# Patient Record
Sex: Female | Born: 1960 | Race: White | Hispanic: No | Marital: Married | Smoking: Never smoker
Health system: Southern US, Community
[De-identification: ages and names within clinical notes are randomized; demographics above are authoritative.]

## PROBLEM LIST (undated history)

## (undated) DIAGNOSIS — Z87442 Personal history of urinary calculi: Secondary | ICD-10-CM

## (undated) DIAGNOSIS — E785 Hyperlipidemia, unspecified: Secondary | ICD-10-CM

## (undated) DIAGNOSIS — K219 Gastro-esophageal reflux disease without esophagitis: Secondary | ICD-10-CM

## (undated) DIAGNOSIS — E041 Nontoxic single thyroid nodule: Secondary | ICD-10-CM

## (undated) DIAGNOSIS — E119 Type 2 diabetes mellitus without complications: Secondary | ICD-10-CM

## (undated) DIAGNOSIS — E669 Obesity, unspecified: Secondary | ICD-10-CM

## (undated) DIAGNOSIS — Z8709 Personal history of other diseases of the respiratory system: Secondary | ICD-10-CM

## (undated) HISTORY — PX: ABDOMINAL HYSTERECTOMY: SHX81

## (undated) HISTORY — DX: Personal history of other diseases of the respiratory system: Z87.09

## (undated) HISTORY — DX: Hyperlipidemia, unspecified: E78.5

## (undated) HISTORY — PX: CHOLECYSTECTOMY: SHX55

## (undated) HISTORY — PX: APPENDECTOMY: SHX54

## (undated) HISTORY — PX: TONSILLECTOMY: SUR1361

## (undated) HISTORY — DX: Obesity, unspecified: E66.9

## (undated) HISTORY — PX: ADENOIDECTOMY: SUR15

## (undated) HISTORY — DX: Gastro-esophageal reflux disease without esophagitis: K21.9

## (undated) HISTORY — PX: BIOPSY THYROID: PRO38

---

## 2006-10-24 HISTORY — PX: ESOPHAGOGASTRODUODENOSCOPY: SHX1529

## 2015-08-13 HISTORY — PX: COLONOSCOPY: SHX174

## 2015-08-23 ENCOUNTER — Encounter: Payer: Self-pay | Admitting: Gastroenterology

## 2019-07-15 DIAGNOSIS — N390 Urinary tract infection, site not specified: Secondary | ICD-10-CM | POA: Diagnosis not present

## 2019-09-03 DIAGNOSIS — I129 Hypertensive chronic kidney disease with stage 1 through stage 4 chronic kidney disease, or unspecified chronic kidney disease: Secondary | ICD-10-CM | POA: Diagnosis not present

## 2019-09-03 DIAGNOSIS — E1169 Type 2 diabetes mellitus with other specified complication: Secondary | ICD-10-CM | POA: Diagnosis not present

## 2019-09-03 DIAGNOSIS — N390 Urinary tract infection, site not specified: Secondary | ICD-10-CM | POA: Diagnosis not present

## 2019-09-03 DIAGNOSIS — E785 Hyperlipidemia, unspecified: Secondary | ICD-10-CM | POA: Diagnosis not present

## 2019-09-03 DIAGNOSIS — E119 Type 2 diabetes mellitus without complications: Secondary | ICD-10-CM | POA: Diagnosis not present

## 2019-09-05 DIAGNOSIS — D1721 Benign lipomatous neoplasm of skin and subcutaneous tissue of right arm: Secondary | ICD-10-CM | POA: Diagnosis not present

## 2019-09-12 DIAGNOSIS — Z1231 Encounter for screening mammogram for malignant neoplasm of breast: Secondary | ICD-10-CM | POA: Diagnosis not present

## 2019-09-16 DIAGNOSIS — G8929 Other chronic pain: Secondary | ICD-10-CM | POA: Insufficient documentation

## 2019-09-16 DIAGNOSIS — M1712 Unilateral primary osteoarthritis, left knee: Secondary | ICD-10-CM | POA: Diagnosis not present

## 2019-09-16 DIAGNOSIS — M25562 Pain in left knee: Secondary | ICD-10-CM | POA: Diagnosis not present

## 2019-09-17 DIAGNOSIS — N390 Urinary tract infection, site not specified: Secondary | ICD-10-CM | POA: Diagnosis not present

## 2019-09-17 DIAGNOSIS — M25562 Pain in left knee: Secondary | ICD-10-CM | POA: Diagnosis not present

## 2019-09-17 DIAGNOSIS — M1712 Unilateral primary osteoarthritis, left knee: Secondary | ICD-10-CM | POA: Diagnosis not present

## 2019-09-25 DIAGNOSIS — M25562 Pain in left knee: Secondary | ICD-10-CM | POA: Diagnosis not present

## 2019-09-25 DIAGNOSIS — M7052 Other bursitis of knee, left knee: Secondary | ICD-10-CM | POA: Diagnosis not present

## 2019-10-15 DIAGNOSIS — N760 Acute vaginitis: Secondary | ICD-10-CM | POA: Diagnosis not present

## 2019-10-15 DIAGNOSIS — R102 Pelvic and perineal pain: Secondary | ICD-10-CM | POA: Diagnosis not present

## 2020-03-03 DIAGNOSIS — E1169 Type 2 diabetes mellitus with other specified complication: Secondary | ICD-10-CM | POA: Diagnosis not present

## 2020-03-03 DIAGNOSIS — E785 Hyperlipidemia, unspecified: Secondary | ICD-10-CM | POA: Diagnosis not present

## 2020-03-03 DIAGNOSIS — E119 Type 2 diabetes mellitus without complications: Secondary | ICD-10-CM | POA: Diagnosis not present

## 2020-03-03 DIAGNOSIS — Z1331 Encounter for screening for depression: Secondary | ICD-10-CM | POA: Diagnosis not present

## 2020-03-03 DIAGNOSIS — Z6841 Body Mass Index (BMI) 40.0 and over, adult: Secondary | ICD-10-CM | POA: Diagnosis not present

## 2020-04-07 DIAGNOSIS — N907 Vulvar cyst: Secondary | ICD-10-CM | POA: Diagnosis not present

## 2020-04-19 DIAGNOSIS — N907 Vulvar cyst: Secondary | ICD-10-CM | POA: Diagnosis not present

## 2020-06-07 DIAGNOSIS — R42 Dizziness and giddiness: Secondary | ICD-10-CM | POA: Diagnosis not present

## 2020-08-08 DIAGNOSIS — B029 Zoster without complications: Secondary | ICD-10-CM | POA: Diagnosis not present

## 2020-09-08 DIAGNOSIS — R109 Unspecified abdominal pain: Secondary | ICD-10-CM | POA: Diagnosis not present

## 2020-09-08 DIAGNOSIS — U071 COVID-19: Secondary | ICD-10-CM | POA: Diagnosis not present

## 2020-09-08 DIAGNOSIS — M549 Dorsalgia, unspecified: Secondary | ICD-10-CM | POA: Diagnosis not present

## 2020-09-08 DIAGNOSIS — E119 Type 2 diabetes mellitus without complications: Secondary | ICD-10-CM | POA: Diagnosis not present

## 2020-09-09 DIAGNOSIS — R1011 Right upper quadrant pain: Secondary | ICD-10-CM | POA: Diagnosis not present

## 2020-09-15 DIAGNOSIS — R1011 Right upper quadrant pain: Secondary | ICD-10-CM | POA: Diagnosis not present

## 2020-10-27 DIAGNOSIS — Z0000 Encounter for general adult medical examination without abnormal findings: Secondary | ICD-10-CM | POA: Diagnosis not present

## 2020-10-27 DIAGNOSIS — Z6841 Body Mass Index (BMI) 40.0 and over, adult: Secondary | ICD-10-CM | POA: Diagnosis not present

## 2020-10-27 DIAGNOSIS — Z1231 Encounter for screening mammogram for malignant neoplasm of breast: Secondary | ICD-10-CM | POA: Diagnosis not present

## 2020-10-27 DIAGNOSIS — Z1211 Encounter for screening for malignant neoplasm of colon: Secondary | ICD-10-CM | POA: Diagnosis not present

## 2020-11-17 DIAGNOSIS — Z1231 Encounter for screening mammogram for malignant neoplasm of breast: Secondary | ICD-10-CM | POA: Diagnosis not present

## 2021-02-02 DIAGNOSIS — K219 Gastro-esophageal reflux disease without esophagitis: Secondary | ICD-10-CM | POA: Diagnosis not present

## 2021-02-02 DIAGNOSIS — E049 Nontoxic goiter, unspecified: Secondary | ICD-10-CM | POA: Diagnosis not present

## 2021-02-02 DIAGNOSIS — E785 Hyperlipidemia, unspecified: Secondary | ICD-10-CM | POA: Diagnosis not present

## 2021-02-02 DIAGNOSIS — E1169 Type 2 diabetes mellitus with other specified complication: Secondary | ICD-10-CM | POA: Diagnosis not present

## 2021-02-02 DIAGNOSIS — E119 Type 2 diabetes mellitus without complications: Secondary | ICD-10-CM | POA: Diagnosis not present

## 2021-02-08 DIAGNOSIS — E041 Nontoxic single thyroid nodule: Secondary | ICD-10-CM | POA: Diagnosis not present

## 2021-02-08 DIAGNOSIS — R1310 Dysphagia, unspecified: Secondary | ICD-10-CM | POA: Diagnosis not present

## 2021-02-08 DIAGNOSIS — E049 Nontoxic goiter, unspecified: Secondary | ICD-10-CM | POA: Diagnosis not present

## 2021-02-22 DIAGNOSIS — E041 Nontoxic single thyroid nodule: Secondary | ICD-10-CM | POA: Diagnosis not present

## 2021-04-13 DIAGNOSIS — E042 Nontoxic multinodular goiter: Secondary | ICD-10-CM | POA: Diagnosis not present

## 2021-04-19 ENCOUNTER — Other Ambulatory Visit: Payer: Self-pay | Admitting: Surgery

## 2021-04-19 DIAGNOSIS — E042 Nontoxic multinodular goiter: Secondary | ICD-10-CM

## 2021-04-20 ENCOUNTER — Other Ambulatory Visit: Payer: Self-pay | Admitting: Surgery

## 2021-04-20 DIAGNOSIS — E042 Nontoxic multinodular goiter: Secondary | ICD-10-CM

## 2021-04-29 ENCOUNTER — Other Ambulatory Visit: Payer: Self-pay

## 2021-05-04 DIAGNOSIS — M545 Low back pain, unspecified: Secondary | ICD-10-CM | POA: Diagnosis not present

## 2021-05-04 DIAGNOSIS — E079 Disorder of thyroid, unspecified: Secondary | ICD-10-CM | POA: Diagnosis not present

## 2021-05-05 ENCOUNTER — Ambulatory Visit
Admission: RE | Admit: 2021-05-05 | Discharge: 2021-05-05 | Disposition: A | Discharge status: Home / Self Care | Payer: BC Managed Care – PPO | Source: Ambulatory Visit | Attending: Surgery | Admitting: Surgery

## 2021-05-05 ENCOUNTER — Other Ambulatory Visit (HOSPITAL_COMMUNITY)
Admission: RE | Admit: 2021-05-05 | Discharge: 2021-05-05 | Disposition: A | Discharge status: Home / Self Care | Payer: BC Managed Care – PPO | Source: Ambulatory Visit | Attending: Surgery | Admitting: Surgery

## 2021-05-05 DIAGNOSIS — E042 Nontoxic multinodular goiter: Secondary | ICD-10-CM

## 2021-05-05 DIAGNOSIS — E041 Nontoxic single thyroid nodule: Secondary | ICD-10-CM | POA: Diagnosis not present

## 2021-05-05 DIAGNOSIS — D440 Neoplasm of uncertain behavior of thyroid gland: Secondary | ICD-10-CM | POA: Diagnosis not present

## 2021-05-05 DIAGNOSIS — E079 Disorder of thyroid, unspecified: Secondary | ICD-10-CM | POA: Diagnosis not present

## 2021-05-06 LAB — CYTOLOGY - NON PAP

## 2021-05-09 NOTE — Progress Notes (Signed)
FNA biopsy is insufficient for diagnosis.  This was a second attempt.  Check with the patient.  If she is willing to try a third time, order FNA biopsy and let them know it is third attempt.  If she doesn't want to do another biopsy, then schedule office appointment in near future to discuss surgery.  Thanks,  tmg  Armandina Gemma, MD [facility] Surgery A [place] practice Office: [phone]

## 2021-05-11 DIAGNOSIS — E1169 Type 2 diabetes mellitus with other specified complication: Secondary | ICD-10-CM | POA: Diagnosis not present

## 2021-05-11 DIAGNOSIS — R946 Abnormal results of thyroid function studies: Secondary | ICD-10-CM | POA: Diagnosis not present

## 2021-05-11 DIAGNOSIS — Z1331 Encounter for screening for depression: Secondary | ICD-10-CM | POA: Diagnosis not present

## 2021-05-11 DIAGNOSIS — E785 Hyperlipidemia, unspecified: Secondary | ICD-10-CM | POA: Diagnosis not present

## 2021-05-25 ENCOUNTER — Ambulatory Visit: Payer: Self-pay | Admitting: Surgery

## 2021-05-25 DIAGNOSIS — E042 Nontoxic multinodular goiter: Secondary | ICD-10-CM | POA: Diagnosis not present

## 2021-05-30 NOTE — Patient Instructions (Addendum)
DUE TO COVID-19 ONLY ONE VISITOR IS ALLOWED TO COME WITH YOU AND STAY IN THE WAITING ROOM ONLY DURING PRE OP AND PROCEDURE DAY OF SURGERY.   Up to two visitors ages 16+ are allowed at one time in a patient's room.  The visitors may rotate out with other people throughout the day.  Additionally, up to two children between the ages of 20 and 64 are allowed and do not count toward the number of allowed visitors.  Children within this age range must be accompanied by an adult visitor.  One adult visitor may remain with the patient overnight and must be in the room by 8 PM.  YOU NEED TO HAVE A COVID 19 TEST ON_2-28-23  @   ______ THIS TEST MUST BE DONE BEFORE SURGERY,     COVID TESTING [place] COME IN THROUGH MAIN ENTRANCE BE SEATED INT THE LOBBY AREA TO THE RIGHT AS YOU COME IN THE MAIN ENTRANCE DIAL [phone] GIVE THEM YOUR NAME AND LET THEM KNOW YOU ARE HERE FOR COVID TESTING    ONCE YOUR COVID TEST IS COMPLETED,  PLEASE Wear a mask when in [place]           Your procedure is scheduled on: 06-09-21   Report to [facility] Main  Entrance   Report to admitting at        [place] AM     Call this number if you have problems the morning of surgery [phone]   Remember: Do not eat food :After Midnight. You may have clear liquids until 0700 am the morning of your surgery then NOTHING BY MOUTH    CLEAR LIQUID DIET                                                                    water Black Coffee and tea, regular and decaf No Creamer or milk                          Plain Jell-O any favor except   NO red                                   Fruit ices (not with fruit pulp)                                      Iced Popsicles                                                                      Cranberry, grape and apple juices Sports drinks like Gatorade Lightly seasoned clear broth or consume(fat free) Sugar, honey  syrup  _____________________________________________________________________      BRUSH YOUR TEETH MORNING OF SURGERY AND RINSE YOUR MOUTH OUT, NO CHEWING GUM CANDY OR  MINTS.     Take these medicines the morning of surgery with A SIP OF WATER: tylenol if needed  [place]   DO NOT TAKE ANY DIABETIC MEDICATIONS DAY OF YOUR SURGERY                               You may not have any metal on your body including hair pins and              piercings  Do not wear jewelry, make-up, lotions, powders,perfumes,        deodorant             Do not wear nail polish on your fingernails or toenails .  Do not shave  48 hours prior to surgery.               Do not bring valuables to the hospital. [place].  Contacts, dentures or bridgework may not be worn into surgery.  You may bring a small overnight bag with you     Patients discharged the day of surgery will not be allowed to drive home. IF YOU ARE HAVING SURGERY AND GOING HOME THE SAME DAY, YOU MUST HAVE AN ADULT TO DRIVE YOU HOME AND BE WITH YOU FOR 24 HOURS. YOU MAY GO HOME BY TAXI OR UBER OR ORTHERWISE, BUT AN ADULT MUST ACCOMPANY YOU HOME AND STAY WITH YOU FOR 24 HOURS.  Name and phone number of your driver:  Special Instructions: N/A              Please read over the following fact sheets you were given: _____________________________________________________________________             [facility] - Preparing for Surgery Before surgery, you can play an important role.  Because skin is not sterile, your skin needs to be as free of germs as possible.  You can reduce the number of germs on your skin by washing with CHG (chlorahexidine gluconate) soap before surgery.  CHG is an antiseptic cleaner which kills germs and bonds with the skin to continue killing germs even after washing. Please DO NOT use if you have an allergy to CHG or antibacterial soaps.  If your skin  becomes reddened/irritated stop using the CHG and inform your nurse when you arrive at Short Stay. Do not shave (including legs and underarms) for at least 48 hours prior to the first CHG shower.  You may shave your face/neck. Please follow these instructions carefully:  1.  Shower with CHG Soap the night before surgery and the  morning of Surgery.  2.  If you choose to wash your hair, wash your hair first as usual with your  normal  shampoo.  3.  After you shampoo, rinse your hair and body thoroughly to remove the  shampoo.                           4.  Use CHG as you would any other liquid soap.  You can apply chg directly  to the skin and wash                       Gently with a scrungie or clean washcloth.  5.  Apply the CHG  Soap to your body ONLY FROM THE NECK DOWN.   Do not use on face/ open                           Wound or open sores. Avoid contact with eyes, ears mouth and genitals (private parts).                       Wash face,  Genitals (private parts) with your normal soap.             6.  Wash thoroughly, paying special attention to the area where your surgery  will be performed.  7.  Thoroughly rinse your body with warm water from the neck down.  8.  DO NOT shower/wash with your normal soap after using and rinsing off  the CHG Soap.                9.  Pat yourself dry with a clean towel.            10.  Wear clean pajamas.            11.  Place clean sheets on your bed the night of your first shower and do not  sleep with pets. Day of Surgery : Do not apply any lotions/deodorants the morning of surgery.  Please wear clean clothes to the hospital/surgery center.  FAILURE TO FOLLOW THESE INSTRUCTIONS MAY RESULT IN THE CANCELLATION OF YOUR SURGERY PATIENT SIGNATURE_________________________________  NURSE SIGNATURE__________________________________  ________________________________________________________________________

## 2021-05-30 NOTE — Progress Notes (Addendum)
PCP - Dr. Lucilla Lame office   [place] Cardiologist - no  PPM/ICD -  Device Orders -  Rep Notified -   Chest x-ray -  EKG - 06-01-21 Stress Test -  ECHO -  Cardiac Cath -   Sleep Study -  CPAP -   Fasting Blood Sugar -  Checks Blood Sugar _____ times a day  Blood Thinner Instructions: Aspirin Instructions:  ERAS Protcol - PRE-SURGERY Ensure or G2-   COVID TEST- 06-07-21 COVID vaccine -no  Activity--Able to walk a flight of stairs without SOB Anesthesia review: DM  Patient denies shortness of breath, fever, cough and chest pain at PAT appointment   All instructions explained to the patient, with a verbal understanding of the material. Patient agrees to go over the instructions while at home for a better understanding. Patient also instructed to self quarantine after being tested for COVID-19. The opportunity to ask questions was provided.

## 2021-06-01 ENCOUNTER — Ambulatory Visit (HOSPITAL_COMMUNITY)
Admission: RE | Admit: 2021-06-01 | Discharge: 2021-06-01 | Disposition: A | Discharge status: Home / Self Care | Payer: BC Managed Care – PPO | Source: Ambulatory Visit | Attending: Anesthesiology | Admitting: Anesthesiology

## 2021-06-01 ENCOUNTER — Encounter (HOSPITAL_COMMUNITY)
Admission: RE | Admit: 2021-06-01 | Discharge: 2021-06-01 | Disposition: A | Discharge status: Home / Self Care | Payer: BC Managed Care – PPO | Source: Ambulatory Visit | Attending: Surgery | Admitting: Surgery

## 2021-06-01 ENCOUNTER — Encounter (HOSPITAL_COMMUNITY): Payer: Self-pay

## 2021-06-01 ENCOUNTER — Other Ambulatory Visit: Payer: Self-pay

## 2021-06-01 VITALS — BP 140/100 | HR 73 | Temp 98.5°F | Resp 18 | Ht 64.0 in | Wt 270.0 lb

## 2021-06-01 DIAGNOSIS — Z01818 Encounter for other preprocedural examination: Secondary | ICD-10-CM | POA: Diagnosis not present

## 2021-06-01 DIAGNOSIS — E119 Type 2 diabetes mellitus without complications: Secondary | ICD-10-CM | POA: Insufficient documentation

## 2021-06-01 HISTORY — DX: Nontoxic single thyroid nodule: E04.1

## 2021-06-01 HISTORY — DX: Type 2 diabetes mellitus without complications: E11.9

## 2021-06-01 HISTORY — DX: Personal history of urinary calculi: Z87.442

## 2021-06-01 LAB — CBC
HCT: 43.8 % (ref 36.0–46.0)
Hemoglobin: 13.7 g/dL (ref 12.0–15.0)
MCH: 28.3 pg (ref 26.0–34.0)
MCHC: 31.3 g/dL (ref 30.0–36.0)
MCV: 90.5 fL (ref 80.0–100.0)
Platelets: 325 10*3/uL (ref 150–400)
RBC: 4.84 MIL/uL (ref 3.87–5.11)
RDW: 13.3 % (ref 11.5–15.5)
WBC: 7 10*3/uL (ref 4.0–10.5)
nRBC: 0 % (ref 0.0–0.2)

## 2021-06-01 LAB — BASIC METABOLIC PANEL
Anion gap: 7 (ref 5–15)
BUN: 19 mg/dL (ref 6–20)
CO2: 27 mmol/L (ref 22–32)
Calcium: 8.8 mg/dL — ABNORMAL LOW (ref 8.9–10.3)
Chloride: 104 mmol/L (ref 98–111)
Creatinine, Ser: 0.73 mg/dL (ref 0.44–1.00)
GFR, Estimated: >60 mL/min (ref >60)
Glucose, Bld: 162 mg/dL — ABNORMAL HIGH (ref 70–99)
Potassium: 4.4 mmol/L (ref 3.5–5.1)
Sodium: 138 mmol/L (ref 135–145)

## 2021-06-01 LAB — GLUCOSE, CAPILLARY: Glucose-Capillary: 170 mg/dL — ABNORMAL HIGH (ref 70–99)

## 2021-06-01 LAB — HEMOGLOBIN A1C
Hgb A1c MFr Bld: 7.2 % — ABNORMAL HIGH (ref 4.8–5.6)
Mean Plasma Glucose: 160 mg/dL

## 2021-06-06 ENCOUNTER — Encounter (HOSPITAL_COMMUNITY): Payer: Self-pay | Admitting: Surgery

## 2021-06-06 DIAGNOSIS — E049 Nontoxic goiter, unspecified: Secondary | ICD-10-CM | POA: Diagnosis present

## 2021-06-06 DIAGNOSIS — E042 Nontoxic multinodular goiter: Secondary | ICD-10-CM | POA: Diagnosis present

## 2021-06-06 NOTE — H&P (Signed)
PROVIDER: Kumari Sculley Charlotta Newton, MD  Chief Complaint: Follow-up (Left thyroid nodule)   History of Present Illness:  Patient returns to my practice to discuss results from attempted fine-needle aspiration biopsy of a dominant 4.1 cm nodule in the left lobe of the thyroid. This was performed in late January. Unfortunately cytopathology showed scant follicular epithelium, Bethesda category I. Patient was offered repeat biopsy but declined. She presents today to discuss surgical resection for definitive diagnosis and management.  Patient does have mild compressive symptoms. She has noted some voice changes. She has a family history of cancer and wishes to have definitive diagnosis.   Review of Systems: A complete review of systems was obtained from the patient. I have reviewed this information and discussed as appropriate with the patient. See HPI as well for other ROS.  Review of Systems  Constitutional: Negative.  HENT: Positive for sore throat.  Voice change  Eyes: Negative.  Respiratory: Negative.  Cardiovascular: Negative.  Gastrointestinal: Negative.  Genitourinary: Negative.  Musculoskeletal: Negative.  Skin: Negative.  Neurological: Negative.  Endo/Heme/Allergies: Negative.  Psychiatric/Behavioral: Negative.    Medical History: Past Medical History:  Diagnosis Date   Arthritis   Diabetes mellitus without complication (CMS-HCC)   Patient Active Problem List  Diagnosis   Multiple thyroid nodules   Past Surgical History:  Procedure Laterality Date   CHOLECYSTECTOMY   HYSTERECTOMY   TONSILLECTOMY    Allergies  Allergen Reactions   Penicillins Other (See Comments)  Unknown Unknown   Current Outpatient Medications on File Prior to Visit  Medication Sig Dispense Refill   dapagliflozin-metformin (XIGDUO XR) 5-1,000 mg XR 24 hr bipahsic tablet Take 1 tablet by mouth once daily   dulaglutide (TRULICITY) 1.5 GX/2.1 mL subcutaneous pen injector INJECT  CONTENTS OF 1 PEN WEEKLY*FILL 8/6** (Patient not taking: Reported on 05/25/2021)   No current facility-administered medications on file prior to visit.   Family History  Problem Relation Age of Onset   High blood pressure (Hypertension) Mother   Skin cancer Father   Obesity Father   High blood pressure (Hypertension) Father   Coronary Artery Disease (Blocked arteries around heart) Father    Social History   Tobacco Use  Smoking Status Never  Smokeless Tobacco Never    Social History   Socioeconomic History   Marital status: Married  Tobacco Use   Smoking status: Never   Smokeless tobacco: Never  Vaping Use   Vaping Use: Never used  Substance and Sexual Activity   Alcohol use: Never   Drug use: Never   Objective:   There were no vitals filed for this visit.  There is no height or weight on file to calculate BMI.  Physical Exam   Limited examination  Palpation shows nodularity in the thyroid. Thyroid is relatively soft without discrete or dominant mass. There is no tenderness. Voice quality is normal.  Assessment and Plan:   Multiple thyroid nodules   Patient returns to my practice today accompanied by her husband. Today we discussed proceeding with left thyroid lobectomy for resection of a dominant 4.1 cm nodule in the left thyroid lobe which has failed attempted fine-needle aspiration biopsy on 2 prior occasions. Patient was offered a third biopsy but declined. Today we discussed lobectomy versus total thyroidectomy. We discussed the risk and benefits of the procedures. We discussed the risk of recurrent laryngeal nerve injury and injury to parathyroid glands. Patient was previously provided with written literature on thyroid surgery to review at home.  Patient  would like to proceed with left thyroid lobectomy. We discussed the size and location of the surgical incision. We discussed the hospital stay to be anticipated. We discussed her postoperative recovery and  return to work and activities. We discussed the potential need for lifelong thyroid hormone supplementation. We also discussed the potential need for completion thyroidectomy in the event of malignancy. The patient understands and agrees to proceed.  The risks and benefits of the procedure have been discussed at length with the patient. The patient understands the proposed procedure, potential alternative treatments, and the course of recovery to be expected. All of the patient's questions have been answered at this time. The patient wishes to proceed with surgery.    ADDENDUM  REFERRING PHYSICIAN: Clydene Pugh, MD  PROVIDER: Mayford Alberg Charlotta Newton, MD   Chief Complaint: New Consultation (Left thyroid nodule)   History of Present Illness:  Patient is referred by Ihor Dow at [facility] medical for surgical evaluation and recommendations regarding newly diagnosed thyroid nodules with mild compressive symptoms. Patient noted mild pressure in the anterior neck. She describes a globus sensation which has improved. Patient demonstrated this to her primary care provider and was sent for an ultrasound examination on February 08, 2021. This demonstrated bilateral small thyroid nodules. Overall the size of the thyroid was relatively normal. However there was a dominant nodule in the mid left thyroid lobe extending into the isthmus measuring 4.1 cm in greatest dimension. Ultrasound-guided fine-needle aspiration biopsy was performed. This demonstrated scant follicular epithelium, Bethesda category I. Patient did have a TSH level determination which was normal at 0.951. Patient has never been on thyroid medication. She has had no prior head or neck surgery with the exception of tonsillectomy. She does have a family history of Graves' disease in the patient's daughter. There is no family history of thyroid malignancy. Patient is accompanied today by her husband. 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  Review  of Systems: A complete review of systems was obtained from the patient. I have reviewed this information and discussed as appropriate with the patient. See HPI as well for other ROS.  Review of Systems  Constitutional: Negative.  HENT:  Globus sensation, voice changes  Eyes: Negative.  Respiratory: Negative.  Cardiovascular: Negative.  Gastrointestinal: Negative.  Genitourinary: Negative.  Musculoskeletal: Negative.  Skin: Negative.  Neurological: Negative.  Endo/Heme/Allergies: Negative.  Psychiatric/Behavioral: Negative.    Medical History: Past Medical History:  Diagnosis Date   Arthritis   Diabetes mellitus without complication (CMS-HCC)   Patient Active Problem List  Diagnosis   Multiple thyroid nodules   Past Surgical History:  Procedure Laterality Date   CHOLECYSTECTOMY   HYSTERECTOMY   TONSILLECTOMY    Allergies  Allergen Reactions   Penicillins Other (See Comments)  Unknown Unknown   Current Outpatient Medications on File Prior to Visit  Medication Sig Dispense Refill   dapagliflozin-metformin (XIGDUO XR) 5-1,000 mg XR 24 hr bipahsic tablet Take 1 tablet by mouth once daily   dulaglutide (TRULICITY) 1.5 WS/5.6 mL subcutaneous pen injector INJECT CONTENTS OF 1 PEN WEEKLY*FILL 8/6**   No current facility-administered medications on file prior to visit.   Family History  Problem Relation Age of Onset   High blood pressure (Hypertension) Mother   Skin cancer Father   Obesity Father   High blood pressure (Hypertension) Father   Coronary Artery Disease (Blocked arteries around heart) Father    Social History   Tobacco Use  Smoking Status Never  Smokeless Tobacco Never  Social History   Socioeconomic History   Marital status: Married  Tobacco Use   Smoking status: Never   Smokeless tobacco: Never  Vaping Use   Vaping Use: Never used  Substance and Sexual Activity   Alcohol use: Never   Drug use: Never   Objective:   Vitals:  Pulse:  90  SpO2: 96%  Weight: (!) 134.5 kg (296 lb 9.6 oz)  Height: 165.1 cm (5\' 5" )   Body mass index is 49.36 kg/m.  Physical Exam   GENERAL APPEARANCE Development: normal Nutritional status: normal Gross deformities: none  SKIN Rash, lesions, ulcers: none Induration, erythema: none Nodules: none palpable  EYES Conjunctiva and lids: normal Pupils: equal and reactive Iris: normal bilaterally  EARS, NOSE, MOUTH, THROAT External ears: no lesion or deformity External nose: no lesion or deformity Hearing: grossly normal Due to Covid-19 pandemic, patient is wearing a mask.  NECK Symmetric: no Trachea: midline Thyroid: Right thyroid lobe is without palpable abnormality. Palpation in the midline and to the left shows a smooth relatively firm nontender nodule extending from the lower left thyroid lobe into the isthmus. There is no associated lymphadenopathy. Voice quality is normal.  CHEST Respiratory effort: normal Retraction or accessory muscle use: no Breath sounds: normal bilaterally Rales, rhonchi, wheeze: none  CARDIOVASCULAR Auscultation: regular rhythm, normal rate Murmurs: none Pulses: radial pulse 2+ palpable Lower extremity edema: none  MUSCULOSKELETAL Station and gait: normal Digits and nails: no clubbing or cyanosis Muscle strength: grossly normal all extremities Range of motion: grossly normal all extremities Deformity: none  LYMPHATIC Cervical: none palpable Supraclavicular: none palpable  PSYCHIATRIC Oriented to person, place, and time: yes Mood and affect: normal for situation Judgment and insight: appropriate for situation  Assessment and Plan:   Multiple thyroid nodules   Patient is referred by her primary care provider for surgical evaluation and recommendations regarding multiple thyroid nodules with a dominant left-sided thyroid nodule with mild compressive symptoms.  Patient provided with a copy of "The Thyroid Book: Medical and Surgical  Treatment of Thyroid Problems", published by [employer], 16 pages. Book reviewed and explained to patient during visit today.  We reviewed the patient's ultrasound as well as the cytopathology report from her biopsy. There is a dominant 4.1 cm nodule in the left thyroid lobe extending into the isthmus. Insufficient material was obtained on biopsy. I have recommended repeating this biopsy. We discussed the potential outcomes including that the biopsy would be benign, have atypia, or demonstrate evidence of malignancy. We discussed management in each of these scenarios. Patient is willing to undergo repeat biopsy to be performed here in [facility] by our radiology group. We will contact her with the results of her cytopathology when they are available.  If the biopsy shows a benign result, then I would recommend a follow-up ultrasound and TSH level in 1 year followed by physical examination here in our office. If the biopsy demonstrates malignancy, then the patient will return to discuss thyroid surgery. If there is atypia, then the sample will be submitted for molecular genetic testing which will take approximately 2 weeks to obtain a definitive result. The patient understands and agrees to proceed.  We will contact the patient with the results of her cytopathology when it is available.    Armandina Gemma, MD [facility] Surgery A [place] practice Office: [phone]

## 2021-06-07 ENCOUNTER — Other Ambulatory Visit: Payer: Self-pay

## 2021-06-07 ENCOUNTER — Encounter (HOSPITAL_COMMUNITY)
Admission: RE | Admit: 2021-06-07 | Discharge: 2021-06-07 | Disposition: A | Discharge status: Home / Self Care | Payer: BC Managed Care – PPO | Source: Ambulatory Visit | Attending: Surgery | Admitting: Surgery

## 2021-06-07 DIAGNOSIS — Z01812 Encounter for preprocedural laboratory examination: Secondary | ICD-10-CM | POA: Insufficient documentation

## 2021-06-07 DIAGNOSIS — Z01818 Encounter for other preprocedural examination: Secondary | ICD-10-CM

## 2021-06-07 DIAGNOSIS — Z20822 Contact with and (suspected) exposure to covid-19: Secondary | ICD-10-CM | POA: Insufficient documentation

## 2021-06-07 LAB — SARS CORONAVIRUS 2 (TAT 6-24 HRS): SARS Coronavirus 2: NEGATIVE

## 2021-06-08 NOTE — Anesthesia Preprocedure Evaluation (Addendum)
Anesthesia Evaluation  ?Patient identified by MRN, date of birth, ID band ?Patient awake ? ? ? ?Reviewed: ?Allergy & Precautions, NPO status , Patient's Chart, lab work & pertinent test results ? ?History of Anesthesia Complications ?Negative for: history of anesthetic complications ? ?Airway ?Mallampati: II ? ?TM Distance: >3 FB ?Neck ROM: Full ? ? ? Dental ?no notable dental hx. ? ?  ?Pulmonary ?neg pulmonary ROS,  ?  ?Pulmonary exam normal ? ? ? ? ? ? ? Cardiovascular ?negative cardio ROS ?Normal cardiovascular exam ? ? ?  ?Neuro/Psych ?negative neurological ROS ? negative psych ROS  ? GI/Hepatic ?negative GI ROS, Neg liver ROS,   ?Endo/Other  ?diabetes, Type 2, Oral Hypoglycemic AgentsMorbid obesity (BMI 46) ? Renal/GU ?negative Renal ROS  ?negative genitourinary ?  ?Musculoskeletal ?negative musculoskeletal ROS ?(+)  ? Abdominal ?  ?Peds ? Hematology ?negative hematology ROS ?(+)   ?Anesthesia Other Findings ?Day of surgery medications reviewed with patient. ? Reproductive/Obstetrics ?negative OB ROS ? ?  ? ? ? ? ? ? ? ? ? ? ? ? ? ?  ?  ? ? ? ? ? ? ? ?Anesthesia Physical ?Anesthesia Plan ? ?ASA: 3 ? ?Anesthesia Plan: General  ? ?Post-op Pain Management: Tylenol PO (pre-op)  ? ?Induction: Intravenous ? ?PONV Risk Score and Plan: 3 and Treatment may vary due to age or medical condition, Ondansetron, Dexamethasone and Midazolam ? ?Airway Management Planned: Oral ETT ? ?Additional Equipment: None ? ?Intra-op Plan:  ? ?Post-operative Plan: Extubation in OR ? ?Informed Consent: I have reviewed the patients History and Physical, chart, labs and discussed the procedure including the risks, benefits and alternatives for the proposed anesthesia with the patient or authorized representative who has indicated his/her understanding and acceptance.  ? ? ? ?Dental advisory given ? ?Plan Discussed with: CRNA ? ?Anesthesia Plan Comments:   ? ? ? ? ? ?Anesthesia Quick Evaluation ? ?

## 2021-06-09 ENCOUNTER — Ambulatory Visit (HOSPITAL_COMMUNITY): Payer: BC Managed Care – PPO | Admitting: Certified Registered Nurse Anesthetist

## 2021-06-09 ENCOUNTER — Other Ambulatory Visit: Payer: Self-pay

## 2021-06-09 ENCOUNTER — Ambulatory Visit (HOSPITAL_COMMUNITY)
Admission: RE | Admit: 2021-06-09 | Discharge: 2021-06-10 | Disposition: A | Discharge status: Home / Self Care | Payer: BC Managed Care – PPO | Attending: Surgery | Admitting: Surgery

## 2021-06-09 ENCOUNTER — Encounter (HOSPITAL_COMMUNITY): Payer: Self-pay | Admitting: Surgery

## 2021-06-09 ENCOUNTER — Encounter (HOSPITAL_COMMUNITY)
Admission: RE | Disposition: A | Discharge status: Home / Self Care | Payer: Self-pay | Source: Home / Self Care | Attending: Surgery

## 2021-06-09 DIAGNOSIS — E669 Obesity, unspecified: Secondary | ICD-10-CM | POA: Diagnosis not present

## 2021-06-09 DIAGNOSIS — E041 Nontoxic single thyroid nodule: Secondary | ICD-10-CM | POA: Diagnosis not present

## 2021-06-09 DIAGNOSIS — E119 Type 2 diabetes mellitus without complications: Secondary | ICD-10-CM | POA: Diagnosis not present

## 2021-06-09 DIAGNOSIS — E042 Nontoxic multinodular goiter: Secondary | ICD-10-CM | POA: Diagnosis not present

## 2021-06-09 DIAGNOSIS — Z809 Family history of malignant neoplasm, unspecified: Secondary | ICD-10-CM | POA: Diagnosis not present

## 2021-06-09 DIAGNOSIS — R499 Unspecified voice and resonance disorder: Secondary | ICD-10-CM | POA: Diagnosis not present

## 2021-06-09 DIAGNOSIS — Z7984 Long term (current) use of oral hypoglycemic drugs: Secondary | ICD-10-CM | POA: Insufficient documentation

## 2021-06-09 DIAGNOSIS — D34 Benign neoplasm of thyroid gland: Secondary | ICD-10-CM | POA: Diagnosis not present

## 2021-06-09 DIAGNOSIS — E049 Nontoxic goiter, unspecified: Secondary | ICD-10-CM | POA: Diagnosis present

## 2021-06-09 DIAGNOSIS — Z6841 Body Mass Index (BMI) 40.0 and over, adult: Secondary | ICD-10-CM | POA: Diagnosis not present

## 2021-06-09 DIAGNOSIS — Z01818 Encounter for other preprocedural examination: Secondary | ICD-10-CM

## 2021-06-09 HISTORY — PX: THYROID LOBECTOMY: SHX420

## 2021-06-09 LAB — GLUCOSE, CAPILLARY: Glucose-Capillary: 147 mg/dL — ABNORMAL HIGH (ref 70–99)

## 2021-06-09 SURGERY — LOBECTOMY, THYROID
Anesthesia: General | Laterality: Left

## 2021-06-09 MED ORDER — ACETAMINOPHEN 500 MG PO TABS
1000.0000 mg | ORAL_TABLET | Freq: Once | ORAL | Status: AC
  Administered 2021-06-09: 1000 mg via ORAL
  Filled 2021-06-09: qty 2

## 2021-06-09 MED ORDER — LIDOCAINE 2% (20 MG/ML) 5 ML SYRINGE
INTRAMUSCULAR | Status: DC | PRN
  Administered 2021-06-09: 100 mg via INTRAVENOUS

## 2021-06-09 MED ORDER — OXYCODONE HCL 5 MG PO TABS: 5.0000 mg | ORAL_TABLET | Freq: Once | ORAL | Status: DC | PRN

## 2021-06-09 MED ORDER — FENTANYL CITRATE PF 50 MCG/ML IJ SOSY
PREFILLED_SYRINGE | INTRAMUSCULAR | Status: AC
  Filled 2021-06-09: qty 2

## 2021-06-09 MED ORDER — PROPOFOL 10 MG/ML IV BOLUS
INTRAVENOUS | Status: DC | PRN
  Administered 2021-06-09: 200 mg via INTRAVENOUS

## 2021-06-09 MED ORDER — CHLORHEXIDINE GLUCONATE CLOTH 2 % EX PADS: 6.0000 | MEDICATED_PAD | Freq: Once | CUTANEOUS | Status: DC

## 2021-06-09 MED ORDER — ONDANSETRON HCL 4 MG/2ML IJ SOLN
INTRAMUSCULAR | Status: DC | PRN
  Administered 2021-06-09: 4 mg via INTRAVENOUS

## 2021-06-09 MED ORDER — TRAMADOL HCL 50 MG PO TABS
50.0000 mg | ORAL_TABLET | Freq: Four times a day (QID) | ORAL | Status: DC | PRN
  Administered 2021-06-09: 50 mg via ORAL
  Filled 2021-06-09: qty 1

## 2021-06-09 MED ORDER — SODIUM CHLORIDE 0.45 % IV SOLN: INTRAVENOUS | Status: DC

## 2021-06-09 MED ORDER — ORAL CARE MOUTH RINSE: 15.0000 mL | Freq: Once | OROMUCOSAL | Status: AC

## 2021-06-09 MED ORDER — ACETAMINOPHEN 325 MG PO TABS
650.0000 mg | ORAL_TABLET | Freq: Four times a day (QID) | ORAL | Status: DC | PRN
  Administered 2021-06-09 – 2021-06-10 (×4): 650 mg via ORAL
  Filled 2021-06-09 (×4): qty 2

## 2021-06-09 MED ORDER — FENTANYL CITRATE (PF) 100 MCG/2ML IJ SOLN
INTRAMUSCULAR | Status: DC | PRN
  Administered 2021-06-09 (×2): 50 ug via INTRAVENOUS

## 2021-06-09 MED ORDER — FENTANYL CITRATE PF 50 MCG/ML IJ SOSY
PREFILLED_SYRINGE | INTRAMUSCULAR | Status: AC
  Filled 2021-06-09: qty 1

## 2021-06-09 MED ORDER — MIDAZOLAM HCL 2 MG/2ML IJ SOLN
INTRAMUSCULAR | Status: AC
  Filled 2021-06-09: qty 2

## 2021-06-09 MED ORDER — ONDANSETRON HCL 4 MG/2ML IJ SOLN: 4.0000 mg | Freq: Four times a day (QID) | INTRAMUSCULAR | Status: DC | PRN

## 2021-06-09 MED ORDER — LACTATED RINGERS IV SOLN: INTRAVENOUS | Status: DC

## 2021-06-09 MED ORDER — ROCURONIUM BROMIDE 10 MG/ML (PF) SYRINGE
PREFILLED_SYRINGE | INTRAVENOUS | Status: DC | PRN
  Administered 2021-06-09: 60 mg via INTRAVENOUS
  Administered 2021-06-09: 20 mg via INTRAVENOUS

## 2021-06-09 MED ORDER — FENTANYL CITRATE (PF) 100 MCG/2ML IJ SOLN
INTRAMUSCULAR | Status: AC
  Filled 2021-06-09: qty 2

## 2021-06-09 MED ORDER — PROPOFOL 10 MG/ML IV BOLUS
INTRAVENOUS | Status: AC
  Filled 2021-06-09: qty 20

## 2021-06-09 MED ORDER — OXYCODONE HCL 5 MG PO TABS
5.0000 mg | ORAL_TABLET | ORAL | Status: DC | PRN
  Administered 2021-06-09: 10 mg via ORAL
  Filled 2021-06-09: qty 2

## 2021-06-09 MED ORDER — DEXAMETHASONE SODIUM PHOSPHATE 4 MG/ML IJ SOLN
INTRAMUSCULAR | Status: DC | PRN
  Administered 2021-06-09: 5 mg via INTRAVENOUS

## 2021-06-09 MED ORDER — MIDAZOLAM HCL 5 MG/5ML IJ SOLN
INTRAMUSCULAR | Status: DC | PRN
  Administered 2021-06-09: 2 mg via INTRAVENOUS

## 2021-06-09 MED ORDER — 0.9 % SODIUM CHLORIDE (POUR BTL) OPTIME
TOPICAL | Status: DC | PRN
  Administered 2021-06-09: 1000 mL

## 2021-06-09 MED ORDER — ACETAMINOPHEN 650 MG RE SUPP: 650.0000 mg | Freq: Four times a day (QID) | RECTAL | Status: DC | PRN

## 2021-06-09 MED ORDER — HEMOSTATIC AGENTS (NO CHARGE) OPTIME
TOPICAL | Status: DC | PRN
Start: 2021-06-09 — End: 2021-06-09
  Administered 2021-06-09: 1

## 2021-06-09 MED ORDER — FENTANYL CITRATE PF 50 MCG/ML IJ SOSY
25.0000 ug | PREFILLED_SYRINGE | INTRAMUSCULAR | Status: DC | PRN
  Administered 2021-06-09 (×3): 50 ug via INTRAVENOUS

## 2021-06-09 MED ORDER — CHLORHEXIDINE GLUCONATE 0.12 % MT SOLN
15.0000 mL | Freq: Once | OROMUCOSAL | Status: AC
  Administered 2021-06-09: 15 mL via OROMUCOSAL

## 2021-06-09 MED ORDER — PROPOFOL 500 MG/50ML IV EMUL
INTRAVENOUS | Status: AC
  Filled 2021-06-09: qty 50

## 2021-06-09 MED ORDER — CEFAZOLIN IN SODIUM CHLORIDE 3-0.9 GM/100ML-% IV SOLN
3.0000 g | INTRAVENOUS | Status: AC
  Administered 2021-06-09: 3 g via INTRAVENOUS
  Filled 2021-06-09: qty 100

## 2021-06-09 MED ORDER — OXYCODONE HCL 5 MG/5ML PO SOLN: 5.0000 mg | Freq: Once | ORAL | Status: DC | PRN

## 2021-06-09 MED ORDER — ONDANSETRON 4 MG PO TBDP: 4.0000 mg | ORAL_TABLET | Freq: Four times a day (QID) | ORAL | Status: DC | PRN

## 2021-06-09 MED ORDER — PHENYLEPHRINE 40 MCG/ML (10ML) SYRINGE FOR IV PUSH (FOR BLOOD PRESSURE SUPPORT)
PREFILLED_SYRINGE | INTRAVENOUS | Status: AC
  Filled 2021-06-09: qty 10

## 2021-06-09 MED ORDER — HYDROMORPHONE HCL 1 MG/ML IJ SOLN
1.0000 mg | INTRAMUSCULAR | Status: DC | PRN
  Administered 2021-06-09: 1 mg via INTRAVENOUS
  Filled 2021-06-09: qty 1

## 2021-06-09 MED ORDER — IBUPROFEN 400 MG PO TABS: 600.0000 mg | ORAL_TABLET | Freq: Four times a day (QID) | ORAL | Status: DC | PRN

## 2021-06-09 MED ORDER — PHENYLEPHRINE 40 MCG/ML (10ML) SYRINGE FOR IV PUSH (FOR BLOOD PRESSURE SUPPORT)
PREFILLED_SYRINGE | INTRAVENOUS | Status: DC | PRN
  Administered 2021-06-09: 120 ug via INTRAVENOUS
  Administered 2021-06-09: 80 ug via INTRAVENOUS

## 2021-06-09 SURGICAL SUPPLY — 31 items
ATTRACTOMAT 16X20 MAGNETIC DRP (DRAPES) ×2 IMPLANT
BAG COUNTER SPONGE SURGICOUNT (BAG) ×2 IMPLANT
BLADE SURG 15 STRL LF DISP TIS (BLADE) ×1 IMPLANT
BLADE SURG 15 STRL SS (BLADE) ×1
CHLORAPREP W/TINT 26 (MISCELLANEOUS) ×2 IMPLANT
CLIP TI MEDIUM 6 (CLIP) ×4 IMPLANT
CLIP TI WIDE RED SMALL 6 (CLIP) ×5 IMPLANT
COVER SURGICAL LIGHT HANDLE (MISCELLANEOUS) ×2 IMPLANT
DERMABOND ADVANCED (GAUZE/BANDAGES/DRESSINGS) ×1
DERMABOND ADVANCED .7 DNX12 (GAUZE/BANDAGES/DRESSINGS) ×1 IMPLANT
DRAPE LAPAROTOMY T 98X78 PEDS (DRAPES) ×2 IMPLANT
DRAPE UTILITY XL STRL (DRAPES) ×2 IMPLANT
ELECT PENCIL ROCKER SW 15FT (MISCELLANEOUS) ×2 IMPLANT
ELECT REM PT RETURN 15FT ADLT (MISCELLANEOUS) ×2 IMPLANT
GAUZE 4X4 16PLY ~~LOC~~+RFID DBL (SPONGE) ×2 IMPLANT
GLOVE SURG SYN 7.5  E (GLOVE) ×2
GLOVE SURG SYN 7.5 E (GLOVE) ×2 IMPLANT
GLOVE SURG SYN 7.5 PF PI (GLOVE) ×2 IMPLANT
GOWN STRL REUS W/TWL XL LVL3 (GOWN DISPOSABLE) ×4 IMPLANT
HEMOSTAT SURGICEL 2X4 FIBR (HEMOSTASIS) ×2 IMPLANT
ILLUMINATOR WAVEGUIDE N/F (MISCELLANEOUS) ×2 IMPLANT
KIT BASIN OR (CUSTOM PROCEDURE TRAY) ×2 IMPLANT
KIT TURNOVER KIT A (KITS) IMPLANT
PACK BASIC VI WITH GOWN DISP (CUSTOM PROCEDURE TRAY) ×2 IMPLANT
SHEARS HARMONIC 9CM CVD (BLADE) ×2 IMPLANT
SUT MNCRL AB 4-0 PS2 18 (SUTURE) ×2 IMPLANT
SUT VIC AB 3-0 SH 18 (SUTURE) ×4 IMPLANT
SYR BULB IRRIG 60ML STRL (SYRINGE) ×2 IMPLANT
TOWEL OR 17X26 10 PK STRL BLUE (TOWEL DISPOSABLE) ×2 IMPLANT
TOWEL OR NON WOVEN STRL DISP B (DISPOSABLE) ×2 IMPLANT
TUBING CONNECTING 10 (TUBING) ×2 IMPLANT

## 2021-06-09 NOTE — Transfer of Care (Signed)
Immediate Anesthesia Transfer of Care Note ? ?Patient: Chelsea Haynes ? ?Procedure(s) Performed: LEFT THYROID LOBECTOMY (Left) ? ?Patient Location: PACU ? ?Anesthesia Type:General ? ?Level of Consciousness: awake and patient cooperative ? ?Airway & Oxygen Therapy: Patient Spontanous Breathing and Patient connected to face mask ? ?Post-op Assessment: Report given to RN and Post -op Vital signs reviewed and stable ? ?Post vital signs: Reviewed and stable ? ?Last Vitals:  ?Vitals Value Taken Time  ?BP 133/85 06/09/21 0900  ?Temp    ?Pulse 65 06/09/21 0904  ?Resp 15 06/09/21 0904  ?SpO2 99 % 06/09/21 0904  ?Vitals shown include unvalidated device data. ? ?Last Pain:  ?Vitals:  ? 06/09/21 0604  ?TempSrc:   ?PainSc: 0-No pain  ?   ? ?  ? ?Complications: No notable events documented. ?

## 2021-06-09 NOTE — Interval H&P Note (Signed)
History and Physical Interval Note: ? ?06/09/2021 ?7:02 AM ? ?Chelsea Haynes  has presented today for surgery, with the diagnosis of LEFT THYROID NODULE.  The various methods of treatment have been discussed with the patient and family. After consideration of risks, benefits and other options for treatment, the patient has consented to  ? ? Procedure(s): ?LEFT THYROID LOBECTOMY (Left) as a surgical intervention.   ? ?The patient's history has been reviewed, patient examined, no change in status, stable for surgery.  I have reviewed the patient's chart and labs.  Questions were answered to the patient's satisfaction.   ? ?Armandina Gemma, MD ?[facility] ?A [facility] practice ?Office: [phone] ? ? ?Armandina Gemma ? ? ?

## 2021-06-09 NOTE — Op Note (Signed)
Procedure Note ? ?Pre-operative Diagnosis:  Left thyroid nodule ? ?Post-operative Diagnosis:  same ? ?Surgeon:  Armandina Gemma, MD ? ?Assistant:  none  ? ?Procedure:  Left thyroid lobectomy and isthmusectomy ? ?Anesthesia:  General ? ?Estimated Blood Loss:  minimal ? ?Drains: none ?        ?Specimen: thyroid lobe to pathology ? ?Indications:  Patient returns to my practice to discuss results from attempted fine-needle aspiration biopsy of a dominant 4.1 cm nodule in the left lobe of the thyroid. This was performed in late January. Unfortunately cytopathology showed scant follicular epithelium, Bethesda category I. Patient was offered repeat biopsy but declined. She presents today to discuss surgical resection for definitive diagnosis and management. ? ?Procedure Details: Procedure was done in OR #4 at the [facility]. The patient was brought to the operating room and placed in a supine position on the operating room table. Following administration of general anesthesia, the patient was positioned and then prepped and draped in the usual aseptic fashion. After ascertaining that an adequate level of anesthesia had been achieved, a small Kocher incision was made with #15 blade. Dissection was carried through subcutaneous tissues and platysma. Hemostasis was achieved with the electrocautery. Skin flaps were elevated cephalad and caudad from the thyroid notch to the sternal notch. A self-retaining retractor was placed for exposure. Strap muscles were incised in the midline and dissection was begun on the left side. Strap muscles were reflected laterally. The left thyroid lobe was moderately enlarged with a central soft nodule. The lobe was gently mobilized with blunt dissection. Superior pole vessels were dissected out and divided individually between small and medium ligaclips with the harmonic scalpel. The thyroid lobe was rolled anteriorly. Branches of the inferior thyroid artery were divided between small  ligaclips with the harmonic scalpel. Inferior venous tributaries were divided between ligaclips. Both the superior and inferior parathyroid glands were identified and preserved on their vascular pedicles. The recurrent laryngeal nerve was identified and preserved along its course. The ligament of Gwenlyn Found was released with the electrocautery and the gland was mobilized onto the anterior trachea. Isthmus was mobilized across the midline. There was no significant pyramidal lobe present. The thyroid parenchyma was transected at the junction of the isthmus and contralateral thyroid lobe with the harmonic scalpel. The thyroid lobe and isthmus were submitted to pathology for review. ? ?The neck was irrigated with warm saline. Fibrillar was placed throughout the operative field. Strap muscles were approximated in the midline with interrupted 3-0 Vicryl sutures. Platysma was closed with interrupted 3-0 Vicryl sutures. Skin was closed with a running 4-0 Monocryl subcuticular suture.  Wound was washed and dried and Dermabond was applied. The patient was awakened from anesthesia and brought to the recovery room. The patient tolerated the procedure well. ? ? ?Armandina Gemma, MD ?[facility] ?Office: [phone]  ?

## 2021-06-09 NOTE — Anesthesia Procedure Notes (Signed)
Procedure Name: Intubation ?Date/Time: 06/09/2021 7:30 AM ?Performed by: Claudia Desanctis, CRNA ?Pre-anesthesia Checklist: Patient identified, Emergency Drugs available, Suction available and Patient being monitored ?Patient Re-evaluated:Patient Re-evaluated prior to induction ?Oxygen Delivery Method: Circle system utilized ?Preoxygenation: Pre-oxygenation with 100% oxygen ?Induction Type: IV induction ?Ventilation: Mask ventilation without difficulty ?Laryngoscope Size: 2 and Miller ?Grade View: Grade I ?Tube type: Oral ?Tube size: 7.0 mm ?Number of attempts: 1 ?Airway Equipment and Method: Stylet ?Placement Confirmation: ETT inserted through vocal cords under direct vision, positive ETCO2 and breath sounds checked- equal and bilateral ?Secured at: 21 cm ?Tube secured with: Tape ?Dental Injury: Teeth and Oropharynx as per pre-operative assessment  ? ? ? ? ?

## 2021-06-09 NOTE — Anesthesia Postprocedure Evaluation (Signed)
Anesthesia Post Note ? ?Patient: Chelsea Haynes ? ?Procedure(s) Performed: LEFT THYROID LOBECTOMY (Left) ? ?  ? ?Patient location during evaluation: PACU ?Anesthesia Type: General ?Level of consciousness: awake and alert ?Pain management: pain level controlled ?Vital Signs Assessment: post-procedure vital signs reviewed and stable ?Respiratory status: spontaneous breathing, nonlabored ventilation and respiratory function stable ?Cardiovascular status: blood pressure returned to baseline ?Postop Assessment: no apparent nausea or vomiting ?Anesthetic complications: no ? ? ?No notable events documented. ? ?Last Vitals:  ?Vitals:  ? 06/09/21 1000 06/09/21 1011  ?BP: (!) 161/94 (!) 153/94  ?Pulse: 73 78  ?Resp: 13 14  ?Temp: 36.4 ?C 36.7 ?C  ?SpO2: 99% 100%  ?  ?Last Pain:  ?Vitals:  ? 06/09/21 1014  ?TempSrc:   ?PainSc: 5   ? ? ?  ?  ?  ?  ?  ?  ? ?Marthenia Rolling ? ? ? ? ?

## 2021-06-10 ENCOUNTER — Encounter (HOSPITAL_COMMUNITY): Payer: Self-pay | Admitting: Surgery

## 2021-06-10 DIAGNOSIS — E041 Nontoxic single thyroid nodule: Secondary | ICD-10-CM | POA: Diagnosis not present

## 2021-06-10 DIAGNOSIS — E669 Obesity, unspecified: Secondary | ICD-10-CM | POA: Diagnosis not present

## 2021-06-10 DIAGNOSIS — Z6841 Body Mass Index (BMI) 40.0 and over, adult: Secondary | ICD-10-CM | POA: Diagnosis not present

## 2021-06-10 DIAGNOSIS — Z809 Family history of malignant neoplasm, unspecified: Secondary | ICD-10-CM | POA: Diagnosis not present

## 2021-06-10 DIAGNOSIS — D34 Benign neoplasm of thyroid gland: Secondary | ICD-10-CM | POA: Diagnosis not present

## 2021-06-10 DIAGNOSIS — Z7984 Long term (current) use of oral hypoglycemic drugs: Secondary | ICD-10-CM | POA: Diagnosis not present

## 2021-06-10 DIAGNOSIS — R499 Unspecified voice and resonance disorder: Secondary | ICD-10-CM | POA: Diagnosis not present

## 2021-06-10 DIAGNOSIS — E119 Type 2 diabetes mellitus without complications: Secondary | ICD-10-CM | POA: Diagnosis not present

## 2021-06-10 LAB — GLUCOSE, CAPILLARY: Glucose-Capillary: 162 mg/dL — ABNORMAL HIGH (ref 70–99)

## 2021-06-10 LAB — SURGICAL PATHOLOGY

## 2021-06-10 MED ORDER — TRAMADOL HCL 50 MG PO TABS: 50.0000 mg | ORAL_TABLET | Freq: Four times a day (QID) | ORAL | 0 refills | Status: AC | PRN

## 2021-06-10 NOTE — Progress Notes (Signed)
Patient was given discharge instructions, and all questions were answered.  Patient was stable for discharge and was taken to the main exit by wheelchair. 

## 2021-06-10 NOTE — Discharge Instructions (Signed)
[facility] - Dr. Adamary Savary  THYROID & PARATHYROID SURGERY:  POST-OP INSTRUCTIONS  Always review the instruction sheet provided by the hospital nurse at discharge.  A prescription for pain medication may be sent to your pharmacy at the time of discharge.  Take your pain medication as prescribed.  If narcotic pain medicine is not needed, then you may take acetaminophen (Tylenol) or ibuprofen (Advil) as needed for pain or soreness.  Take your normal home medications as prescribed unless otherwise directed.  If you need a refill on your pain medication, please contact the office during regular business hours.  Prescriptions will not be processed by the office after 5:00PM or on weekends.  Start with a light diet upon arrival home, such as soup and crackers or toast.  Be sure to drink plenty of fluids.  Resume your normal diet the day after surgery.  Most patients will experience some swelling and bruising on the chest and neck area.  Ice packs will help for the first 48 hours after arriving home.  Swelling and bruising will take several days to resolve.   It is common to experience some constipation after surgery.  Increasing fluid intake and taking a stool softener (Colace) will usually help to prevent this problem.  A mild laxative (Milk of Magnesia or Miralax) should be taken according to package directions if there has been no bowel movement after 48 hours.  Dermabond glue covers your incision. This seals the wound and you may shower at any time. The Dermabond will remain in place for about a week.  You may gradually remove the glue when it loosens around the edges.  If you need to loosen the Dermabond for removal, apply a layer of Vaseline to the wound for 15 minutes and then remove with a Kleenex. Your sutures are under the skin and will not show - they will dissolve on their own.  You may resume light daily activities beginning the day after discharge (such as self-care,  walking, climbing stairs), gradually increasing activities as tolerated. You may have sexual intercourse when it is comfortable. Refrain from any heavy lifting or straining until approved by your doctor. You may drive when you no longer are taking prescription pain medication, you can comfortably wear a seatbelt, and you can safely maneuver your car and apply the brakes.  You will see your doctor in the office for a follow-up appointment approximately three weeks after your surgery.  Make sure that you call for this appointment within a day or two after you arrive home to insure a convenient appointment time. Please have any requested laboratory tests performed a few days prior to your office visit so that the results will be available at your follow up appointment.  WHEN TO CALL THE [facility] OFFICE: -- Fever greater than 101.5 -- Inability to urinate -- Nausea and/or vomiting - persistent -- Extreme swelling or bruising -- Continued bleeding from incision -- Increased pain, redness, or drainage from the incision -- Difficulty swallowing or breathing -- Muscle cramping or spasms -- Numbness or tingling in hands or around lips  The clinic staff is available to answer your questions during regular business hours.  Please don't hesitate to call and ask to speak to one of the nurses if you have concerns.  [facility] OFFICE: [phone] (24 hours)  Please sign up for MyChart accounts. This will allow you to communicate directly with my nurse or myself without having to call the office. It will also allow you   to view your test results. You will need to enroll in MyChart for my office ([facility]) and for the hospital ([facility]).  Isabelle Matt, MD [facility] A [facility] 

## 2021-06-10 NOTE — Discharge Summary (Signed)
?  ?  Physician Discharge Summary  ? ?Patient ID: ?Rechel Mcneary ?MRN: [id number] ?DOB/AGE: 59-Mar-1962 59 y.o. ? ?Admit date: 06/09/2021 ? ?Discharge date: 06/10/2021 ? ?Discharge Diagnoses:  Principal Problem: ?  Multiple thyroid nodules ?Active Problems: ?  Enlargement of thyroid ?  Left thyroid nodule ? ? ?Discharged Condition: good ? ?Hospital Course: Patient was admitted for observation following thyroid surgery.  Post op course was uncomplicated.  Pain was well controlled.  Tolerated diet.  Patient was prepared for discharge home on POD#1. ? ?Consults: None ? ?Treatments: surgery: left thyroid lobectomy and isthmusectomy ? ?Discharge Exam: ?Blood pressure (!) 146/81, pulse 67, temperature (!) 97.5 ?F (36.4 ?C), temperature source Oral, resp. rate 16, height 5\' 4"  (1.626 m), weight 122.5 kg, SpO2 97 %. ?HEENT - clear ?Neck - wound dry and intact; mild ecchymosis, mild STS; voice normal ? ?Disposition: Home ? ?Discharge Instructions   ? ? Diet - low sodium heart healthy   Complete by: As directed ?  ? Increase activity slowly   Complete by: As directed ?  ? No dressing needed   Complete by: As directed ?  ? ?  ? ?Allergies as of 06/10/2021   ? ?   Reactions  ? Shellfish Allergy Anaphylaxis, Rash  ? Penicillins Itching  ?   ? ?  ? ?  ?Medication List  ?  ? ?TAKE these medications   ? ?acetaminophen 500 MG tablet ?Commonly known as: TYLENOL ?Take 1,000 mg by mouth every 6 (six) hours as needed for headache or moderate pain. ?  ?traMADol 50 MG tablet ?Commonly known as: ULTRAM ?Take 1-2 tablets (50-100 mg total) by mouth every 6 (six) hours as needed for moderate pain. ?  ?Xigduo XR 08-998 MG Tb24 ?Generic drug: Dapagliflozin-metFORMIN HCl ER ?Take 1 tablet by mouth daily. ?  ? ?  ? ?  ?  ? ? ?  ?Discharge Care Instructions  ?(From admission, onward)  ?  ? ? ?  ? ?  Start     Ordered  ? 06/10/21 0000  No dressing needed       ? 06/10/21 0844  ? ?  ?  ? ?  ? ? Follow-up Information   ? ? Armandina Gemma, MD. Schedule an  appointment as soon as possible for a visit in 3 week(s).   ?Specialty: General Surgery ?Why: For wound re-check ?Contact information: ?[address] ?[address] ?[place] [address] ?[phone] ? ? ?  ?  ? ?  ?  ? ?  ? ? ?Armandina Gemma, MD ?[facility] ?Office: [phone] ? ? ?Signed: ?Armandina Gemma ?06/10/2021, 8:45 AM ?  ? ?

## 2021-07-12 DIAGNOSIS — Z9009 Acquired absence of other part of head and neck: Secondary | ICD-10-CM | POA: Insufficient documentation

## 2021-07-13 DIAGNOSIS — E041 Nontoxic single thyroid nodule: Secondary | ICD-10-CM | POA: Diagnosis not present

## 2021-07-13 DIAGNOSIS — Z9009 Acquired absence of other part of head and neck: Secondary | ICD-10-CM | POA: Diagnosis not present

## 2021-07-13 DIAGNOSIS — E049 Nontoxic goiter, unspecified: Secondary | ICD-10-CM | POA: Diagnosis not present

## 2021-07-13 DIAGNOSIS — E042 Nontoxic multinodular goiter: Secondary | ICD-10-CM | POA: Diagnosis not present

## 2021-09-30 DIAGNOSIS — N182 Chronic kidney disease, stage 2 (mild): Secondary | ICD-10-CM | POA: Diagnosis not present

## 2021-09-30 DIAGNOSIS — I129 Hypertensive chronic kidney disease with stage 1 through stage 4 chronic kidney disease, or unspecified chronic kidney disease: Secondary | ICD-10-CM | POA: Diagnosis not present

## 2021-09-30 DIAGNOSIS — E785 Hyperlipidemia, unspecified: Secondary | ICD-10-CM | POA: Diagnosis not present

## 2021-09-30 DIAGNOSIS — E1169 Type 2 diabetes mellitus with other specified complication: Secondary | ICD-10-CM | POA: Diagnosis not present

## 2022-01-02 DIAGNOSIS — I129 Hypertensive chronic kidney disease with stage 1 through stage 4 chronic kidney disease, or unspecified chronic kidney disease: Secondary | ICD-10-CM | POA: Diagnosis not present

## 2022-01-02 DIAGNOSIS — E1169 Type 2 diabetes mellitus with other specified complication: Secondary | ICD-10-CM | POA: Diagnosis not present

## 2022-01-02 DIAGNOSIS — E890 Postprocedural hypothyroidism: Secondary | ICD-10-CM | POA: Diagnosis not present

## 2022-01-02 DIAGNOSIS — N182 Chronic kidney disease, stage 2 (mild): Secondary | ICD-10-CM | POA: Diagnosis not present

## 2022-02-09 DIAGNOSIS — B372 Candidiasis of skin and nail: Secondary | ICD-10-CM | POA: Diagnosis not present

## 2022-02-09 DIAGNOSIS — Z6841 Body Mass Index (BMI) 40.0 and over, adult: Secondary | ICD-10-CM | POA: Diagnosis not present

## 2022-02-09 DIAGNOSIS — L237 Allergic contact dermatitis due to plants, except food: Secondary | ICD-10-CM | POA: Diagnosis not present

## 2022-02-15 DIAGNOSIS — R21 Rash and other nonspecific skin eruption: Secondary | ICD-10-CM | POA: Diagnosis not present

## 2022-02-15 DIAGNOSIS — Z6841 Body Mass Index (BMI) 40.0 and over, adult: Secondary | ICD-10-CM | POA: Diagnosis not present

## 2022-03-30 DIAGNOSIS — S8391XA Sprain of unspecified site of right knee, initial encounter: Secondary | ICD-10-CM | POA: Diagnosis not present

## 2022-03-30 DIAGNOSIS — S8001XA Contusion of right knee, initial encounter: Secondary | ICD-10-CM | POA: Diagnosis not present

## 2022-03-30 DIAGNOSIS — M25561 Pain in right knee: Secondary | ICD-10-CM | POA: Diagnosis not present

## 2022-03-30 DIAGNOSIS — W19XXXA Unspecified fall, initial encounter: Secondary | ICD-10-CM | POA: Diagnosis not present

## 2022-03-30 DIAGNOSIS — M47816 Spondylosis without myelopathy or radiculopathy, lumbar region: Secondary | ICD-10-CM | POA: Diagnosis not present

## 2022-03-30 DIAGNOSIS — Z043 Encounter for examination and observation following other accident: Secondary | ICD-10-CM | POA: Diagnosis not present

## 2022-03-30 DIAGNOSIS — M2391 Unspecified internal derangement of right knee: Secondary | ICD-10-CM | POA: Diagnosis not present

## 2022-03-30 DIAGNOSIS — M25551 Pain in right hip: Secondary | ICD-10-CM | POA: Diagnosis not present

## 2022-03-30 DIAGNOSIS — M25461 Effusion, right knee: Secondary | ICD-10-CM | POA: Diagnosis not present

## 2022-03-30 DIAGNOSIS — M545 Low back pain, unspecified: Secondary | ICD-10-CM | POA: Diagnosis not present

## 2022-04-05 DIAGNOSIS — E1169 Type 2 diabetes mellitus with other specified complication: Secondary | ICD-10-CM | POA: Diagnosis not present

## 2022-04-05 DIAGNOSIS — M545 Low back pain, unspecified: Secondary | ICD-10-CM | POA: Diagnosis not present

## 2022-04-05 DIAGNOSIS — I129 Hypertensive chronic kidney disease with stage 1 through stage 4 chronic kidney disease, or unspecified chronic kidney disease: Secondary | ICD-10-CM | POA: Diagnosis not present

## 2022-04-05 DIAGNOSIS — E890 Postprocedural hypothyroidism: Secondary | ICD-10-CM | POA: Diagnosis not present

## 2022-04-05 DIAGNOSIS — E785 Hyperlipidemia, unspecified: Secondary | ICD-10-CM | POA: Diagnosis not present

## 2022-04-11 DIAGNOSIS — M1611 Unilateral primary osteoarthritis, right hip: Secondary | ICD-10-CM | POA: Diagnosis not present

## 2022-04-11 DIAGNOSIS — M1711 Unilateral primary osteoarthritis, right knee: Secondary | ICD-10-CM | POA: Diagnosis not present

## 2022-04-11 DIAGNOSIS — S8001XA Contusion of right knee, initial encounter: Secondary | ICD-10-CM | POA: Diagnosis not present

## 2022-07-05 DIAGNOSIS — E785 Hyperlipidemia, unspecified: Secondary | ICD-10-CM | POA: Diagnosis not present

## 2022-07-05 DIAGNOSIS — E1169 Type 2 diabetes mellitus with other specified complication: Secondary | ICD-10-CM | POA: Diagnosis not present

## 2022-07-05 DIAGNOSIS — I129 Hypertensive chronic kidney disease with stage 1 through stage 4 chronic kidney disease, or unspecified chronic kidney disease: Secondary | ICD-10-CM | POA: Diagnosis not present

## 2022-07-05 DIAGNOSIS — E890 Postprocedural hypothyroidism: Secondary | ICD-10-CM | POA: Diagnosis not present

## 2022-07-18 ENCOUNTER — Ambulatory Visit (INDEPENDENT_AMBULATORY_CARE_PROVIDER_SITE_OTHER): Payer: BC Managed Care – PPO | Admitting: Podiatry

## 2022-07-18 DIAGNOSIS — B351 Tinea unguium: Secondary | ICD-10-CM | POA: Diagnosis not present

## 2022-07-18 DIAGNOSIS — L603 Nail dystrophy: Secondary | ICD-10-CM | POA: Diagnosis not present

## 2022-07-18 DIAGNOSIS — L600 Ingrowing nail: Secondary | ICD-10-CM

## 2022-07-18 NOTE — Patient Instructions (Signed)

## 2022-07-18 NOTE — Progress Notes (Signed)
Subjective:  Patient ID: Chelsea Haynes, female    DOB: 09-Aug-1960,  MRN: [id number]  Chief Complaint  Patient presents with   Foot Problem    fungal toenail infection, RIGHT GTREAT TOE,  PATIENT HAS CONCERNS WITH BILATERAL 2ND DIGITS,     62 y.o. female presents with concern for bilateral hallux pain on the borders.  Also concerned with toenail fungal infection especially the right hallux nail.  Additionally patient has some deformity in the second toe on both feet.  She has some pain at the tip of the toes due to the toes pressing into the bottom of the shoes.  Past Medical History:  Diagnosis Date   Diabetes mellitus without complication (HCC)    type 2   History of kidney stones    Thyroid nodule     Allergies  Allergen Reactions   Shellfish Allergy Anaphylaxis and Rash   Penicillins Itching         ROS: Negative except as per HPI above  Objective:  General: AAO x3, NAD  Dermatological: Attention to left and right hallux there is noted to be ingrowing of the nail on the bilateral borders. Incurvation is present along the bilateral nail border of the bilateral great toe. There is localized edema without any erythema or increase in warmth around the nail border. There is no drainage or pus. There is no ascending cellulitis. No malodor. No open lesions or pre-ulcerative lesions.    Vascular:  Dorsalis Pedis artery and Posterior Tibial artery pedal pulses are 2/4 bilateral.  Capillary fill time < 3 sec to all digits.   Neruologic: Grossly intact via light touch bilateral. Protective threshold intact to all sites bilateral.   Musculoskeletal: Flexible hammertoe deformity noted to the second digit bilaterally.  Pain at the distal tuft of the toe and mild callus located there.  Gait: Unassisted, Nonantalgic.   No images are attached to the encounter.  Assessment:   1. Ingrown nail of great toe of right foot   2. Ingrown nail of great toe of left foot   3. Onychomycosis    4. Nail dystrophy      Plan:  Patient was evaluated and treated and all questions answered.    Ingrown Nail, bilaterally -Patient elects to proceed with minor surgery to remove ingrown toenail today. Consent reviewed and signed by patient. -Ingrown nail excised. See procedure note. -Educated on post-procedure care including soaking. Written instructions provided and reviewed. -Patient to follow up in 2 weeks for nail check.  Procedure: Excision of Ingrown Toenail Location: Bilateral 1st toe  bilateral  nail borders. Anesthesia: Lidocaine 1% plain; 1.5 mL and Marcaine 0.5% plain; 1.5 mL, digital block. Skin Prep: Betadine. Dressing: Silvadene; telfa; dry, sterile, compression dressing. Technique: Following skin prep, the toe was exsanguinated and a tourniquet was secured at the base of the toe. The affected nail border was freed, split with a nail splitter, and excised. Chemical matrixectomy was then performed with phenol and irrigated out with alcohol. The tourniquet was then removed and sterile dressing applied. Disposition: Patient tolerated procedure well. Patient to return in 2 weeks for follow-up.   #Hammertoe deformity - Discussed conservative and surgical treatment options with the patient - I recommend possibly trying for removal of the nails on the 2nd toes in future to see if this helps with pain as her pain does seem to be at the nail itself vs the PIPJ -Also discussed surgical correction of toes and what this would entail if she has significant  hammertoe pain and wants to proceed.  Return in about 2 weeks (around 08/01/2022) for f/u ingrown removal bilateral hallux.          Corinna Gab, DPM [facility] / [facility]

## 2022-08-01 ENCOUNTER — Ambulatory Visit (INDEPENDENT_AMBULATORY_CARE_PROVIDER_SITE_OTHER): Payer: BC Managed Care – PPO | Admitting: Podiatry

## 2022-08-01 DIAGNOSIS — B351 Tinea unguium: Secondary | ICD-10-CM | POA: Diagnosis not present

## 2022-08-01 DIAGNOSIS — L600 Ingrowing nail: Secondary | ICD-10-CM | POA: Diagnosis not present

## 2022-08-01 NOTE — Progress Notes (Signed)
Subjective: Chelsea Haynes is a 62 y.o.  female returns to office today for follow up evaluation after having bilateral Hallux bilateral border nail ingrown removal with phenol and alcohol matrixectomy approximately 2 weeks ago. Patient has been soaking using epsom salts and applying topical antibiotic covered with bandaid daily. Patient denies fevers, chills, nausea, vomiting. Denies any calf pain, chest pain, SOB.   Objective:  Vitals: Reviewed  General: Well developed, nourished, in no acute distress, alert and oriented x3   Dermatology: Skin is warm, dry and supple bilateral. bilateral hallux nail border appears to be clean, dry, with mild granular tissue and surrounding scab. There is no surrounding erythema, edema, drainage/purulence. The remaining nails appear unremarkable at this time. There are no other lesions or other signs of infection present.  Neurovascular status: Intact. No lower extremity swelling; No pain with calf compression bilateral.  Musculoskeletal: Decreased tenderness to palpation of the bilateral hallux nail fold(s). Muscular strength within normal limits bilateral.   Assesement and Plan: S/p phenol and alcohol matrixectomy to the  bilateral hallux nail bilateral, doing well.   -Continue soaking in epsom salts twice a day followed by antibiotic ointment and a band-aid. Can leave uncovered at night. Continue this until completely healed.  -If the area has not healed in 2 weeks, call the office for follow-up appointment, or sooner if any problems arise.  -Monitor for any signs/symptoms of infection. Call the office immediately if any occur or go directly to the emergency room. Call with any questions/concerns.        Corinna Gab, DPM [facility] / [facility]                   08/01/2022

## 2022-08-14 ENCOUNTER — Ambulatory Visit (INDEPENDENT_AMBULATORY_CARE_PROVIDER_SITE_OTHER): Payer: BC Managed Care – PPO | Admitting: Podiatry

## 2022-08-14 ENCOUNTER — Ambulatory Visit (INDEPENDENT_AMBULATORY_CARE_PROVIDER_SITE_OTHER): Payer: BC Managed Care – PPO

## 2022-08-14 DIAGNOSIS — M2042 Other hammer toe(s) (acquired), left foot: Secondary | ICD-10-CM | POA: Diagnosis not present

## 2022-08-14 NOTE — Progress Notes (Signed)
  Subjective:  Patient ID: Chelsea Haynes, female    DOB: 06-16-1960,  MRN: [id number]  Chief Complaint  Patient presents with   Hammer Toe    Hammertoe to 2nd toe on the left foot. Denies any pain at this time but the top of the toe rubs the shoe.     62 y.o. female presents today for surgical evaluation of the left foot hammertoe of the second toe.  She says she does have some pain in the toe and it hurts when it rubs on the top of her shoe.  She had previously discussed this with me in the past and we said that she wanted to wait until it got worse and has now been hurting her worse and therefore wants to proceed with surgical correction of the toe at this time.  Denies any other issues or complaints with the left foot  Past Medical History:  Diagnosis Date   Diabetes mellitus without complication (HCC)    type 2   History of kidney stones    Thyroid nodule     Allergies  Allergen Reactions   Shellfish Allergy Anaphylaxis and Rash   Penicillins Itching         ROS: Negative except as per HPI above  Objective:  General: AAO x3, NAD  Dermatological: With inspection and palpation of the right and left lower extremities there are no open sores, no preulcerative lesions, no rash or signs of infection present. Nails are of normal length thickness and coloration.   Vascular:  Dorsalis Pedis artery and Posterior Tibial artery pedal pulses are 2/4 bilateral.  Capillary fill time < 3 sec to all digits.   Neruologic: Grossly intact via light touch bilateral. Protective threshold intact to all sites bilateral.   Musculoskeletal: Hammer toe deformity noted in the second toe of the left foot with some mild extension at the metatarsal phalangeal joint and flexion contracture of the proximal interphalangeal joint.  Pain on palpation of the proximal interphalangeal joint dorsally.  Gait: Unassisted, Nonantalgic.   No images are attached to the encounter.  Radiographs:  Date: 08/14/2022 XR  the left foot Weightbearing AP/Lateral/Oblique   Findings: Hammertoe deformity noted of the second toe of the left foot with decreased joint space and irregularity at the proximal phalange joint.  There is mild extension at the MPJ on lateral view.  Primarily sagittal plane there is no transverse plane noted deformity Assessment:   1. Hammertoe of second toe of left foot   2. Hammertoe of left foot      Plan:  Patient was evaluated and treated and all questions answered.  # Hammertoe of second toe of left foot -Discussed with the patient conservative and surgical treatment options for hammertoe.  She has failed conservative treatments including toe gel cap, wider and deeper toe box shoe modification, and NSAIDs and would like to proceed with surgical intervention -Discussed surgically I recommend hammertoe repair with proximal phalange joint arthrodesis with implant. -Discussed the risk benefits alternatives and possible complications associated with such procedure as well as the expected postoperative recovery course. -Patient is aware of the above and wishes to proceed with correction.  Informed surgical consent was obtained at this visit.  We will begin surgical planning.         Corinna Gab, DPM [facility] / [facility]

## 2022-08-15 IMAGING — DX DG CHEST 2V
2 series · 2 of 2 positions shown · non-contrast
Comparison: PA Lat 04/30/2012

CLINICAL DATA: Preoperative for thyroid surgery.

EXAM:
CHEST - 2 VIEW

[chest pa]
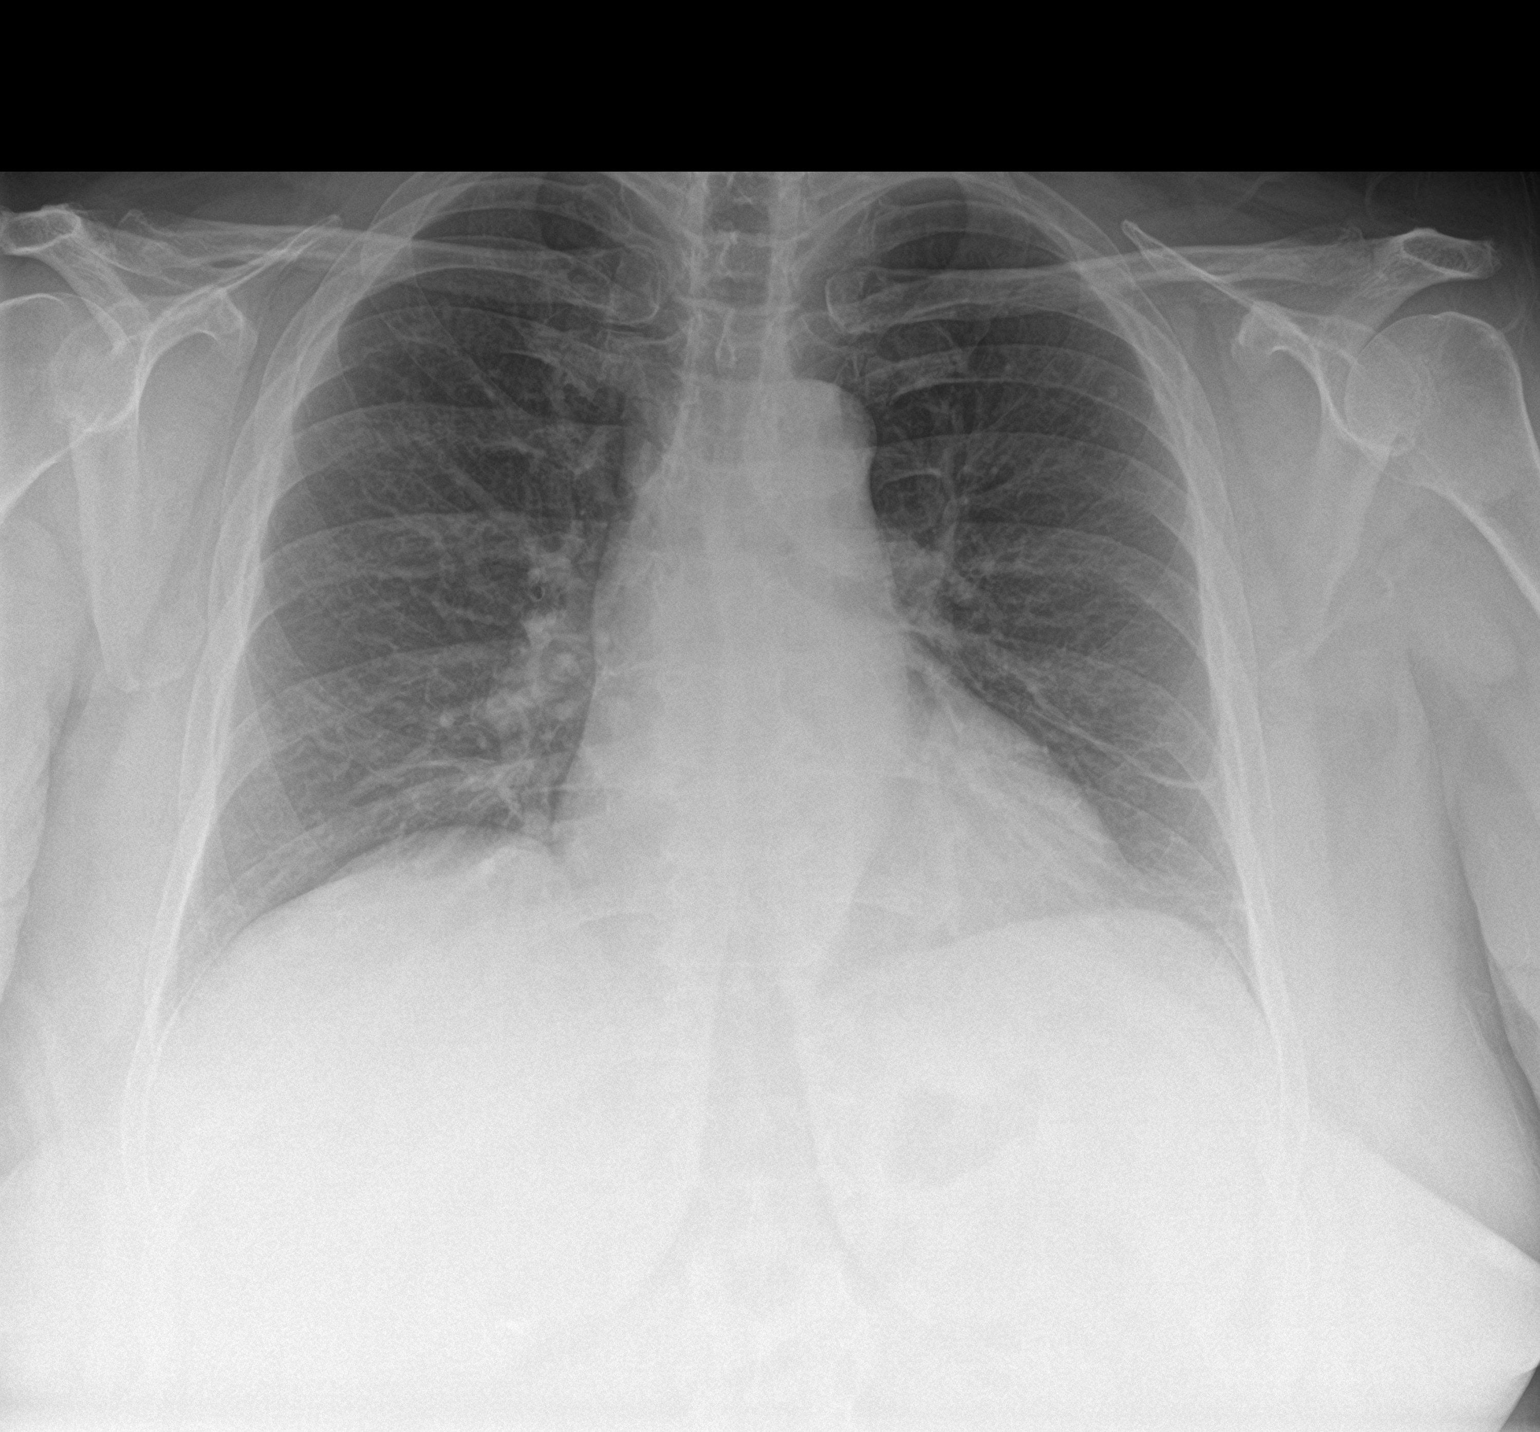

[chest lat]
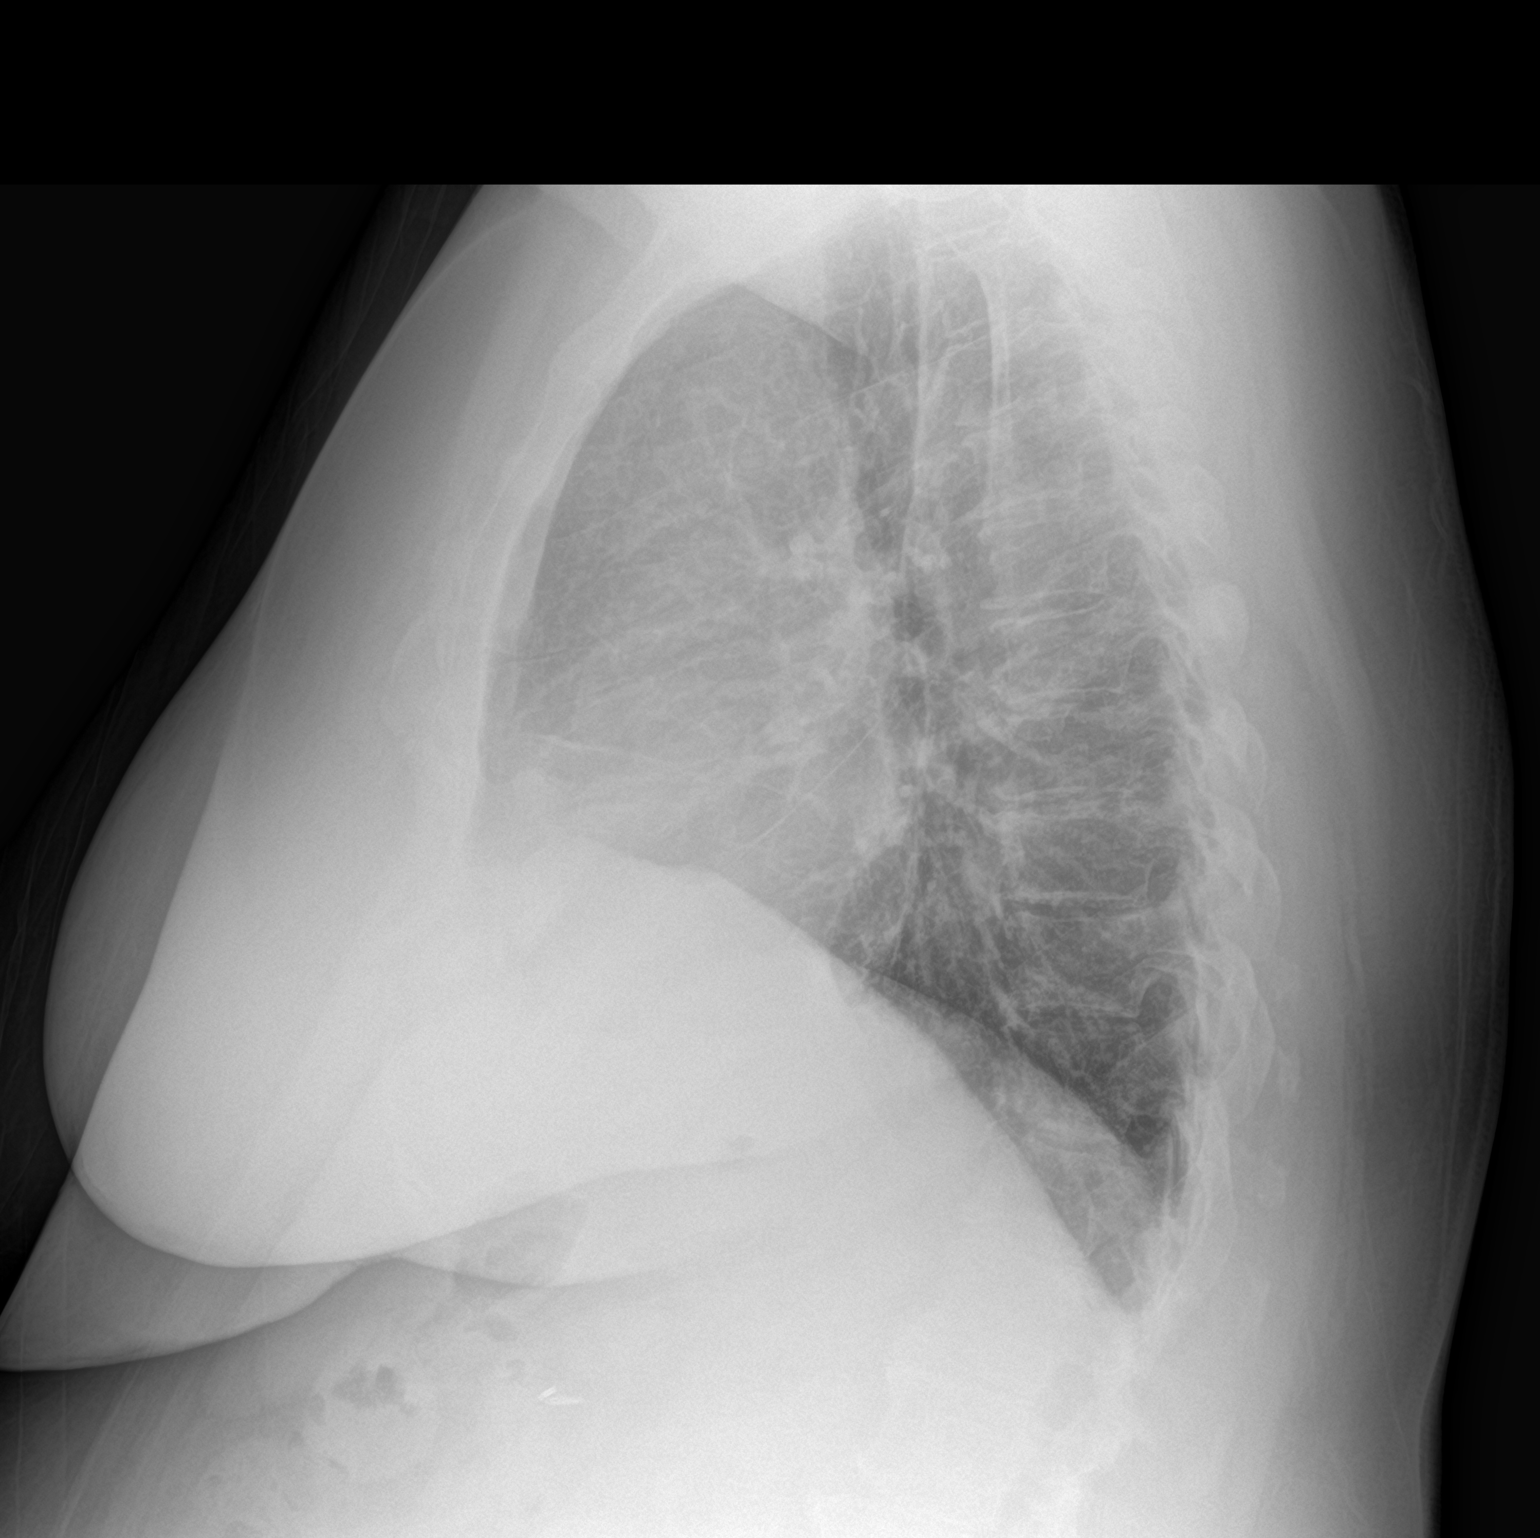

[2 of 2 positions shown; findings below may reference images not displayed]

FINDINGS: The cardiac size is normal. There is a stable mediastinum with mild
aortic tortuosity and atherosclerosis. Linear scar-like opacities
are again noted in the lateral base of the left lung in the lingula.

The lungs are clear of infiltrates. No pleural effusion is seen.
There is mild osteopenia, degenerative change and slight
dextroscoliosis of the thoracic spine.
IMPRESSION: No active cardiopulmonary disease.  Aortic atherosclerosis.

## 2022-08-22 ENCOUNTER — Telehealth: Payer: Self-pay | Admitting: Urology

## 2022-08-22 NOTE — Telephone Encounter (Signed)
DOS - 08/30/22  HAMMERTOE REPAIR 2ND LEFT --- [address]  [employer] EFFECTIVE DATE - 01/08/22  DEDUCTIBLE - $5,000.00 W/ $1,243.87 REMANING OOP - $7,500.00 W/ $2,479.81 REMAINING COINSURANCE - 30%  RECEIVED FAX FROM [employer] STATING THAT FOR CPT CODE 60454 HAS BEEN APPROVED, AUTH # [id number], GOOD FROM 08/30/22 - 10/28/22.

## 2022-08-29 ENCOUNTER — Other Ambulatory Visit (INDEPENDENT_AMBULATORY_CARE_PROVIDER_SITE_OTHER): Payer: BC Managed Care – PPO | Admitting: Podiatry

## 2022-08-29 MED ORDER — IBUPROFEN 600 MG PO TABS: 600.0000 mg | ORAL_TABLET | Freq: Three times a day (TID) | ORAL | 0 refills | Status: AC | PRN

## 2022-08-29 MED ORDER — OXYCODONE-ACETAMINOPHEN 5-325 MG PO TABS
1.0000 | ORAL_TABLET | ORAL | 0 refills | Status: AC | PRN
Start: 2022-08-29 — End: 2022-09-03

## 2022-08-29 MED ORDER — CLINDAMYCIN HCL 300 MG PO CAPS: 300.0000 mg | ORAL_CAPSULE | Freq: Three times a day (TID) | ORAL | 0 refills | Status: AC

## 2022-08-29 NOTE — Progress Notes (Signed)
Post-op meds sent

## 2022-08-30 ENCOUNTER — Encounter: Payer: Self-pay | Admitting: Podiatry

## 2022-08-30 DIAGNOSIS — M2042 Other hammer toe(s) (acquired), left foot: Secondary | ICD-10-CM | POA: Diagnosis not present

## 2022-08-30 DIAGNOSIS — E119 Type 2 diabetes mellitus without complications: Secondary | ICD-10-CM | POA: Diagnosis not present

## 2022-09-06 ENCOUNTER — Ambulatory Visit (INDEPENDENT_AMBULATORY_CARE_PROVIDER_SITE_OTHER): Payer: BC Managed Care – PPO | Admitting: Podiatry

## 2022-09-06 ENCOUNTER — Ambulatory Visit (INDEPENDENT_AMBULATORY_CARE_PROVIDER_SITE_OTHER): Payer: BC Managed Care – PPO

## 2022-09-06 DIAGNOSIS — M2042 Other hammer toe(s) (acquired), left foot: Secondary | ICD-10-CM

## 2022-09-06 DIAGNOSIS — Z09 Encounter for follow-up examination after completed treatment for conditions other than malignant neoplasm: Secondary | ICD-10-CM

## 2022-09-06 NOTE — Progress Notes (Signed)
       Chief Complaint  Patient presents with   Routine Post Op    POV #1 DOS 08/30/2022 LT FOOT 2ND HAMMERTOE CORRECTION WITH IMPLANT ARTHRODESIS    HPI: 62 y.o. female presenting today for postop examination.  She is 1 week postop after having left second hammertoe repair with arthrodesis utilizing implant.  Denies fever chills night sweats nausea vomiting.  States that she has been able to keep the dressing clean and dry and intact.  She states that the pain is fairly well controlled.  Past Medical History:  Diagnosis Date   Diabetes mellitus without complication (HCC)    type 2   History of kidney stones    Thyroid nodule     Past Surgical History:  Procedure Laterality Date   ABDOMINAL HYSTERECTOMY     BIOPSY THYROID     CHOLECYSTECTOMY     THYROID LOBECTOMY Left 06/09/2021   Procedure: LEFT THYROID LOBECTOMY;  Surgeon: Darnell Level, MD;  Location: [facility];  Service: General;  Laterality: Left;   TONSILLECTOMY      Allergies  Allergen Reactions   Shellfish Allergy Anaphylaxis and Rash   Penicillins Itching          Physical Exam:  The patient is alert and oriented x3 in no acute distress.  Palpable pedal pulses bilaterally. Capillary refill within normal limits.  No appreciable edema.  No erythema or calor.  The left second toe is maintaining corrected position.  Sutures are intact.  No gapping or dehiscence are noted across the incision on the second toe.  Minimal ecchymosis noted.  Light touch sensation intact to the distal tip of the left first second and third toes.  Radiographic Exam (3 weightbearing views left foot 09/06/2022): The implant for the arthrodesis of the left second toe is in good position.  There is good bone to bone surface contact.  The toe is maintaining corrected position.   Assessment/Plan of Care: 1. Postoperative examination   2. Hammertoe of left foot     Discussed clinical findings with patient today. Patient informed that the toe is  healing well.  Also informed patient that there are no signs of infection today.  The toe was redressed with Betadine soaked 6 gauze and a dry sterile fluff dressing was applied followed by an Ace wrap.  She will continue to keep the dressing clean and dry.  She is nervous about trying to keep it dry in the shower but we discussed how to make an at home shower bag if she cannot find them at the drugstore.  F/u one week   Dakari Stabler DBurna Mortimer, DPM, FACFAS [facility]     [address] [place], [place] [address]                Office [phone]  Fax [phone]

## 2022-09-12 ENCOUNTER — Ambulatory Visit (INDEPENDENT_AMBULATORY_CARE_PROVIDER_SITE_OTHER): Payer: BC Managed Care – PPO | Admitting: Podiatry

## 2022-09-12 DIAGNOSIS — Z09 Encounter for follow-up examination after completed treatment for conditions other than malignant neoplasm: Secondary | ICD-10-CM

## 2022-09-12 DIAGNOSIS — M2042 Other hammer toe(s) (acquired), left foot: Secondary | ICD-10-CM

## 2022-09-12 NOTE — Progress Notes (Signed)
  Subjective:  Patient ID: Chelsea Haynes, female    DOB: Aug 20, 1960,  MRN: [id number]  Chief Complaint  Patient presents with   Routine Post Op    POV #2 DOS 08/30/2022 LT FOOT 2ND HAMMERTOE CORRECTION WITH IMPLANT ARTHRODESIS. Sutures are intact. Patient is doing well.     DOS: 08/30/2022 Procedure: Left foot second hammertoe repair with PIP J arthrodesis with implant  62 y.o. female returns for post-op check.  Patient doing well this is her second postoperative visit.  She is approximately 2 weeks from surgery.  Has been weightbearing as tolerated in postop shoe.  Dressing clean dry intact as previously instructed  Review of Systems: Negative except as noted in the HPI. Denies N/V/F/Ch.   Objective:  There were no vitals filed for this visit. There is no height or weight on file to calculate BMI. Constitutional Well developed. Well nourished.  Vascular Foot warm and well perfused. Capillary refill normal to all digits.  Calf is soft and supple, no posterior calf or knee pain, negative Homans' sign  Neurologic Normal speech. Oriented to person, place, and time. Epicritic sensation to light touch grossly present bilaterally.  Dermatologic Skin healing well without signs of infection. Skin edges well coapted without signs of infection.  Orthopedic: Tenderness to palpation noted about the surgical site.   Multiple view plain film radiographs: Deferred at this visit Assessment:   1. Postoperative examination   2. Hammertoe of second toe of left foot    Plan:  Patient was evaluated and treated and all questions answered.  S/p foot surgery left second hammertoe repair with PIPJ arthrodesis with implant -Progressing as expected post-operatively. -XR: Deferred at this visit -WB Status: Weightbearing as tolerated in postop shoe for another 2 weeks -Sutures: Removed in total this visit. -Medications: Ibuprofen as needed for pain -Okay to get foot wet in the shower at this point in  time  Return in about 2 weeks (around 09/26/2022) for 3rd POV L 2nd HT repair.         Corinna Gab, DPM [facility] / [facility]

## 2022-09-26 ENCOUNTER — Ambulatory Visit (INDEPENDENT_AMBULATORY_CARE_PROVIDER_SITE_OTHER): Payer: BC Managed Care – PPO | Admitting: Podiatry

## 2022-09-26 ENCOUNTER — Ambulatory Visit (INDEPENDENT_AMBULATORY_CARE_PROVIDER_SITE_OTHER): Payer: BC Managed Care – PPO

## 2022-09-26 DIAGNOSIS — Z09 Encounter for follow-up examination after completed treatment for conditions other than malignant neoplasm: Secondary | ICD-10-CM | POA: Diagnosis not present

## 2022-09-26 DIAGNOSIS — M2042 Other hammer toe(s) (acquired), left foot: Secondary | ICD-10-CM

## 2022-09-26 NOTE — Progress Notes (Signed)
  Subjective:  Patient ID: Chelsea Haynes, female    DOB: 05/19/1960,  MRN: [id number]  Chief Complaint  Patient presents with   Routine Post Op    POV #3 DOS 08/30/2022 LT FOOT 2ND HAMMERTOE CORRECTION WITH IMPLANT ARTHRODESIS. Patient experiencing pain in between left hallux and 2nd toe. Patient has swelling to the 2nd toe and has a red pigmentation above the incision line.     DOS: 08/30/2022 Procedure: Left foot second hammertoe repair with PIP J arthrodesis with implant  62 y.o. female returns for post-op check.  Patient doing well this is her third postoperative visit.  She is approximately 4 weeks from surgery.  Has been weightbearing and now back in regular shoes.  Does note some pain about the surgical site but she when she has swelling in the toe when she has noticed a decrease in slightly.  Review of Systems: Negative except as noted in the HPI. Denies N/V/F/Ch.   Objective:  There were no vitals filed for this visit. There is no height or weight on file to calculate BMI. Constitutional Well developed. Well nourished.  Vascular Foot warm and well perfused. Capillary refill normal to all digits.  Calf is soft and supple, no posterior calf or knee pain, negative Homans' sign  Neurologic Normal speech. Oriented to person, place, and time. Epicritic sensation to light touch grossly present bilaterally.  Dermatologic Skin healing well without signs of infection. Skin edges well coapted without signs of infection.  Orthopedic: Decreased tenderness and edema to palpation noted about the surgical site.   Multiple view plain film radiographs: Deferred at this visit Assessment:   1. Postoperative examination   2. Hammertoe of second toe of left foot    Plan:  Patient was evaluated and treated and all questions answered.  S/p foot surgery left second hammertoe repair with PIPJ arthrodesis with implant -Progressing as expected post-operatively. -Discussed that edema will continue  to decrease the further we get from surgery however toes stay swollen for a long time postoperatively -XR: Deferred at this visit -WB Status: Weightbearing as tolerated in regular shoes -Medications: Ibuprofen as needed for pain -Continue to work range of motion of the toe  No follow-ups on file.         Corinna Gab, DPM [facility] / [facility]

## 2022-10-04 ENCOUNTER — Telehealth: Payer: Self-pay | Admitting: Podiatry

## 2022-10-04 DIAGNOSIS — E890 Postprocedural hypothyroidism: Secondary | ICD-10-CM | POA: Diagnosis not present

## 2022-10-04 DIAGNOSIS — N182 Chronic kidney disease, stage 2 (mild): Secondary | ICD-10-CM | POA: Diagnosis not present

## 2022-10-04 DIAGNOSIS — I129 Hypertensive chronic kidney disease with stage 1 through stage 4 chronic kidney disease, or unspecified chronic kidney disease: Secondary | ICD-10-CM | POA: Diagnosis not present

## 2022-10-04 DIAGNOSIS — E1169 Type 2 diabetes mellitus with other specified complication: Secondary | ICD-10-CM | POA: Diagnosis not present

## 2022-10-04 DIAGNOSIS — Z6841 Body Mass Index (BMI) 40.0 and over, adult: Secondary | ICD-10-CM | POA: Diagnosis not present

## 2022-10-04 NOTE — Telephone Encounter (Signed)
Chelsea Haynes called and would like to rtw on 10/16/2022. Please advise? If she returns to work what type of restrictions would she need?

## 2022-10-24 ENCOUNTER — Ambulatory Visit (INDEPENDENT_AMBULATORY_CARE_PROVIDER_SITE_OTHER): Payer: BC Managed Care – PPO | Admitting: Podiatry

## 2022-10-24 DIAGNOSIS — M2042 Other hammer toe(s) (acquired), left foot: Secondary | ICD-10-CM

## 2022-10-24 DIAGNOSIS — Z09 Encounter for follow-up examination after completed treatment for conditions other than malignant neoplasm: Secondary | ICD-10-CM

## 2022-10-24 NOTE — Progress Notes (Signed)
  Subjective:  Patient ID: Chelsea Haynes, female    DOB: [birth year],  MRN: [id number]  Chief Complaint  Patient presents with   Routine Post Op    POV #4 DOS 08/30/2022 LT FOOT 2ND HAMMERTOE CORRECTION WITH IMPLANT ARTHRODESIS. Some swelling to 2nd toe on left foot. Pain at times.     DOS: 08/30/2022 Procedure: Left foot second hammertoe repair with PIP J arthrodesis with implant  62 y.o. female returns for post-op check.  Patient doing well fourth postoperative visit.  She is approximately 8 weeks from surgery.  Has been walking in regular shoes.  Says the pain is much decreased that causes occasional shooting pain of the toe but only very rarely does this happen.  Overall she reports the swelling has decreased slightly however there is still some swelling but it does fluctuate throughout the day.  Review of Systems: Negative except as noted in the HPI. Denies N/V/F/Ch.   Objective:  There were no vitals filed for this visit. There is no height or weight on file to calculate BMI. Constitutional Well developed. Well nourished.  Vascular Foot warm and well perfused. Capillary refill normal to all digits.  Calf is soft and supple, no posterior calf or knee pain, negative Homans' sign  Neurologic Normal speech. Oriented to person, place, and time. Epicritic sensation to light touch grossly present bilaterally.  Dermatologic Incision on the toe well-healed without any scar tissue present no erythema or open wound present.  There is edema noted about the second toe proximal phalanx  Orthopedic: Decreased tenderness and edema to palpation noted about the surgical site.   Multiple view plain film radiographs: Deferred at this visit Assessment:   1. Postoperative examination   2. Hammertoe of second toe of left foot     Plan:  Patient was evaluated and treated and all questions answered.  S/p foot surgery left second hammertoe repair with PIPJ arthrodesis with implant -Progressing as  expected post-operatively.  Overall improving with decreasing edema -Discussed that edema will continue to decrease the further we get from surgery however toes stay swollen for a long time postoperatively plan on about 3 months postop -XR: Deferred at this visit, will take final x-rays at next appointment -WB Status: Weightbearing as tolerated in regular shoes -Medications: Ibuprofen as needed for pain -Continue to work range of motion of the toe -Patient is cleared to return to work without limitations wearing good supportive shoes  Return in about 6 weeks (around 12/05/2022) for POV L 2nd toe.         Corinna Gab, DPM [facility] / [facility]

## 2022-12-05 ENCOUNTER — Ambulatory Visit (INDEPENDENT_AMBULATORY_CARE_PROVIDER_SITE_OTHER): Payer: BC Managed Care – PPO | Admitting: Podiatry

## 2022-12-05 ENCOUNTER — Ambulatory Visit (INDEPENDENT_AMBULATORY_CARE_PROVIDER_SITE_OTHER): Payer: BC Managed Care – PPO

## 2022-12-05 DIAGNOSIS — Z09 Encounter for follow-up examination after completed treatment for conditions other than malignant neoplasm: Secondary | ICD-10-CM | POA: Diagnosis not present

## 2022-12-05 DIAGNOSIS — M2042 Other hammer toe(s) (acquired), left foot: Secondary | ICD-10-CM

## 2022-12-05 DIAGNOSIS — G5782 Other specified mononeuropathies of left lower limb: Secondary | ICD-10-CM | POA: Diagnosis not present

## 2022-12-05 NOTE — Progress Notes (Signed)
Subjective:  Patient ID: Chelsea Haynes, female    DOB: Jul 19, 1960,  MRN: [id number]  Chief Complaint  Patient presents with   Routine Post Op    POV #5 DOS 08/30/2022 LT FOOT 2ND HAMMERTOE CORRECTION WITH IMPLANT ARTHRODESIS    62 y.o. female presents for exam postop #5 status post left foot second hammertoe correction with implant arthrodesis of the proximal interphalangeal joint.  Date of surgery 08/28/2022.  She is now approximately 3 months postop.  She says she does not have any pain in the second toe but does have some occasional electric shooting sensation to the end of the toe.  She also notes she has a new problem on the outside of her left foot in between the third and fourth toes.  Past Medical History:  Diagnosis Date   Diabetes mellitus without complication (HCC)    type 2   History of kidney stones    Thyroid nodule     Allergies  Allergen Reactions   Shellfish Allergy Anaphylaxis and Rash   Penicillins Itching         ROS: Negative except as per HPI above  Objective:  General: AAO x3, NAD  Dermatological: With inspection and palpation of the right and left lower extremities there are no open sores, no preulcerative lesions, no rash or signs of infection present. Nails are of normal length thickness and coloration.   Vascular:  Dorsalis Pedis artery and Posterior Tibial artery pedal pulses are 2/4 bilateral.  Capillary fill time < 3 sec to all digits.   Neruologic: Grossly intact via light touch bilateral. Protective threshold intact to all sites bilateral.   Musculoskeletal: Nontender palpation of the proximal interphalange joint of the second toe on the left foot.  Very mild edema noted at this area.  No erythema or wound issue.  There is significant pain with palpation in the third interspace on the left foot.  This is just proximal to the metatarsal heads.  Pain with dorsal plantar palpation as well as side-to-side squeeze.  Gait: Unassisted, Nonantalgic.    No images are attached to the encounter.  Radiographs:  Date: 12/05/2022 XR the left foot Weightbearing AP/Lateral/Oblique   Findings: Attention directed to the second toe there is no to be implant at the proximal interphalange joint with osseous bridging noted across the PIPJ however incomplete.  Decreased sagittal plane deformity in the second toe from prior Assessment:   1. Interdigital neuroma of left foot   2. Hammertoe of second toe of left foot   3. Postoperative examination      Plan:  Patient was evaluated and treated and all questions answered.  # Third interspace neuroma of the left foot -Discussed with patient I believe she may have a interdigital neuroma in the third interspace on the left foot. -Recommend steroid injection to decrease pain and inflammation in the area -After sterile prep injected 1 cc half percent Marcaine plain with 1 cc Kenalog 10 and observed in Marchabout the area of maximal pain on palpation  # Status post left second hammertoe hammertoe repair with PIPJ arthrodesis with implant -Continues to improve no pain aside from some nerve complaints. -Offered nerve medication such as gabapentin however patient wishes to defer at this time we will continue to see how it progresses  Return in about 6 weeks (around 01/16/2023) for f/u L 3rd interspace neuroma.          Corinna Gab, DPM [facility] / [facility]

## 2022-12-19 ENCOUNTER — Ambulatory Visit (INDEPENDENT_AMBULATORY_CARE_PROVIDER_SITE_OTHER): Payer: BC Managed Care – PPO | Admitting: Podiatry

## 2022-12-19 DIAGNOSIS — Z09 Encounter for follow-up examination after completed treatment for conditions other than malignant neoplasm: Secondary | ICD-10-CM | POA: Diagnosis not present

## 2022-12-19 DIAGNOSIS — M2042 Other hammer toe(s) (acquired), left foot: Secondary | ICD-10-CM | POA: Diagnosis not present

## 2022-12-19 DIAGNOSIS — G5782 Other specified mononeuropathies of left lower limb: Secondary | ICD-10-CM

## 2022-12-19 NOTE — Progress Notes (Signed)
Subjective:  Patient ID: Chelsea Haynes, female    DOB: May 13, 1960,  MRN: [id number]  Chief Complaint  Patient presents with   Routine Post Op    POV #6 DOS 08/30/2022 LT FOOT 2ND HAMMERTOE CORRECTION WITH IMPLANT ARTHRODESIS. Pain to foot is completelly gon. Patient is still having numbness to the left ball of foot and 2nd toe.     62 y.o. female presents for exam postop #5 status post left foot second hammertoe correction with implant arthrodesis of the proximal interphalangeal joint.  Date of surgery 08/28/2022.  She is now approximately 4 months postop.    Overall she is doing well she denies any pain at the surgical site in the second toe.  She reports some occasional numbness underneath the ball of the foot however thinks it is more to the side of the second MPJ and not near the surgical site.  Steroid injection given to the third interspace at last visit had was very helpful decrease her pain with concern for possible neuroma in that area.  Past Medical History:  Diagnosis Date   Diabetes mellitus without complication (HCC)    type 2   History of kidney stones    Thyroid nodule     Allergies  Allergen Reactions   Shellfish Allergy Anaphylaxis and Rash   Penicillins Itching         ROS: Negative except as per HPI above  Objective:  General: AAO x3, NAD  Dermatological: With inspection and palpation of the right and left lower extremities there are no open sores, no preulcerative lesions, no rash or signs of infection present. Nails are of normal length thickness and coloration.   Vascular:  Dorsalis Pedis artery and Posterior Tibial artery pedal pulses are 2/4 bilateral.  Capillary fill time < 3 sec to all digits.   Neruologic: Grossly intact via light touch bilateral. Protective threshold intact to all sites bilateral.   Musculoskeletal: Nontender palpation of the proximal interphalange joint of the second toe on the left foot.  Decreased edema.  No pain in the third or  fourth interspace on the left foot with palpation  Gait: Unassisted, Nonantalgic.   No images are attached to the encounter.  Radiographs:  Date: 12/05/2022 XR the left foot Weightbearing AP/Lateral/Oblique   Findings: Attention directed to the second toe there is no to be implant at the proximal interphalange joint with osseous bridging noted across the PIPJ however incomplete.  Decreased sagittal plane deformity in the second toe from prior Assessment:   1. Interdigital neuroma of left foot   2. Hammertoe of second toe of left foot   3. Postoperative examination       Plan:  Patient was evaluated and treated and all questions answered.  # Third interspace neuroma of the left foot -Improved after steroid injection last visit -Continue to monitor if there is a flareup in pain would consider another steroid injection  # Status post left second hammertoe hammertoe repair with PIPJ arthrodesis with implant -Continues to improve no pain aside from some nerve complaints. -Improved at this time patient has mild numbness however no pain at the toe.  Overall she is fully healed after second hammertoe correction with PIPJ arthrodesis with implant -Recommend continued monitoring at this time if she has any flareups or problems with the toe she is to call and we will get her back in  No follow-ups on file.          Corinna Gab, DPM [facility] / [facility]

## 2022-12-27 DIAGNOSIS — E119 Type 2 diabetes mellitus without complications: Secondary | ICD-10-CM | POA: Diagnosis not present

## 2023-01-02 DIAGNOSIS — I129 Hypertensive chronic kidney disease with stage 1 through stage 4 chronic kidney disease, or unspecified chronic kidney disease: Secondary | ICD-10-CM | POA: Diagnosis not present

## 2023-01-02 DIAGNOSIS — N182 Chronic kidney disease, stage 2 (mild): Secondary | ICD-10-CM | POA: Diagnosis not present

## 2023-01-02 DIAGNOSIS — K219 Gastro-esophageal reflux disease without esophagitis: Secondary | ICD-10-CM | POA: Diagnosis not present

## 2023-01-02 DIAGNOSIS — E1122 Type 2 diabetes mellitus with diabetic chronic kidney disease: Secondary | ICD-10-CM | POA: Diagnosis not present

## 2023-01-24 DIAGNOSIS — Z1231 Encounter for screening mammogram for malignant neoplasm of breast: Secondary | ICD-10-CM | POA: Diagnosis not present

## 2023-04-18 DIAGNOSIS — I129 Hypertensive chronic kidney disease with stage 1 through stage 4 chronic kidney disease, or unspecified chronic kidney disease: Secondary | ICD-10-CM | POA: Diagnosis not present

## 2023-04-18 DIAGNOSIS — E1122 Type 2 diabetes mellitus with diabetic chronic kidney disease: Secondary | ICD-10-CM | POA: Diagnosis not present

## 2023-04-18 DIAGNOSIS — N182 Chronic kidney disease, stage 2 (mild): Secondary | ICD-10-CM | POA: Diagnosis not present

## 2023-04-18 DIAGNOSIS — K219 Gastro-esophageal reflux disease without esophagitis: Secondary | ICD-10-CM | POA: Diagnosis not present

## 2023-07-17 DIAGNOSIS — I129 Hypertensive chronic kidney disease with stage 1 through stage 4 chronic kidney disease, or unspecified chronic kidney disease: Secondary | ICD-10-CM | POA: Diagnosis not present

## 2023-07-17 DIAGNOSIS — E1122 Type 2 diabetes mellitus with diabetic chronic kidney disease: Secondary | ICD-10-CM | POA: Diagnosis not present

## 2023-07-17 DIAGNOSIS — K219 Gastro-esophageal reflux disease without esophagitis: Secondary | ICD-10-CM | POA: Diagnosis not present

## 2023-07-17 DIAGNOSIS — N182 Chronic kidney disease, stage 2 (mild): Secondary | ICD-10-CM | POA: Diagnosis not present

## 2023-07-17 DIAGNOSIS — R200 Anesthesia of skin: Secondary | ICD-10-CM | POA: Diagnosis not present

## 2023-09-07 ENCOUNTER — Encounter: Payer: Self-pay | Admitting: Podiatry

## 2023-09-07 ENCOUNTER — Ambulatory Visit (INDEPENDENT_AMBULATORY_CARE_PROVIDER_SITE_OTHER): Admitting: Podiatry

## 2023-09-07 DIAGNOSIS — M722 Plantar fascial fibromatosis: Secondary | ICD-10-CM

## 2023-09-07 DIAGNOSIS — M76821 Posterior tibial tendinitis, right leg: Secondary | ICD-10-CM

## 2023-09-07 DIAGNOSIS — M7751 Other enthesopathy of right foot: Secondary | ICD-10-CM | POA: Diagnosis not present

## 2023-09-07 MED ORDER — TRIAMCINOLONE ACETONIDE 10 MG/ML IJ SUSP
10.0000 mg | Freq: Once | INTRAMUSCULAR | Status: AC
  Administered 2023-09-07: 10 mg

## 2023-09-07 NOTE — Patient Instructions (Signed)

## 2023-09-07 NOTE — Progress Notes (Signed)
 Chief Complaint  Patient presents with   Foot Pain    Right heel pain, medial side between the arrows. Feels like she is walking on a sharp stone. Pain is really bad in the mornings and after rest. She does walk on concrete all the time. She is describing PF pain. Last A1c was 6.2 in April and no anti coag.     HPI: 63 y.o. female presenting today with c/o pain in the bottom of the right heel.  Pain is rated as 9/10.  She states that she already has meloxicam and muscle relaxers at home for her back pain.  She is requesting to be out of work for a couple days secondary to the pain and inability to stand for prolonged periods because of the heel pain.  She had to leave work today secondary to the pain.  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  Past Medical History:  Diagnosis Date   Diabetes mellitus without complication (HCC)    type 2   History of kidney stones    Thyroid  nodule    Past Surgical History:  Procedure Laterality Date   ABDOMINAL HYSTERECTOMY     BIOPSY THYROID      CHOLECYSTECTOMY     THYROID  LOBECTOMY Left 06/09/2021   Procedure: LEFT THYROID  LOBECTOMY;  Surgeon: Oralee Billow, MD;  Location: [facility];  Service: General;  Laterality: Left;   TONSILLECTOMY     Allergies  Allergen Reactions   Shellfish Allergy Anaphylaxis and Rash   Penicillins Itching          Physical Exam: General: The patient is alert and oriented x3 in no acute distress.  Dermatology:  No ecchymosis, erythema, or edema bilateral.  No open lesions.    Vascular: Palpable pedal pulses bilaterally. Capillary refill within normal limits.  No appreciable edema.    Neurological: Epicritic sensation is intact  Musculoskeletal Exam:  There is pain on palpation of the plantarmedial & plantarcentral aspect of right heel.  No gaps or nodules within the plantar fascia.  Positive Windlass mechanism bilateral.  Antalgic gait noted with first few steps upon standing.   There is pain on palpation of the posterior tibial tendon near its insertion into the navicular.  There is also some posterior medial discomfort on palpation of the lower one third aspect of the leg just superior and posterior to the medial malleolus.  No pain on palpation of achilles tendon bilateral.  Ankle df less than 10 degrees with knee extended b/l.   Assessment/Plan of Care: 1. Plantar fasciitis, right   2. Bursitis of right foot   3. Posterior tibial tendinitis, right     Meds ordered this encounter  Medications   triamcinolone  acetonide (KENALOG ) 10 MG/ML injection 10 mg   PR FT ARCH SUPRT PREMOLD LONGIT FOR HOME USE ONLY DME NIGHT SPLINT  -Reviewed etiology of plantar fasciitis with patient.  Discussed treatment options with patient today, including cortisone injection, NSAID course of treatment, stretching exercises, physical therapy, use of night splint, rest, icing the heel, arch supports/orthotics, and supportive shoe gear.    With the patient's verbal consent, a corticosteroid injection was administered to the right heel, consisting of a mixture of 1% lidocaine  plain, 0.5% Sensorcaine plain, and Kenalog -10 for a total of 1.5cc administered.  A Band-aid was applied. Pain level post-injection is 5/10.  Night splint fitted and dispensed today.  This is a static AFO device with soft interface material to  be worn when sleeping or nonweightbearing.  Stretching exercises printed and dispensed at checkout  Powerstep inserts fitted and dispensed today.  These will also help the posterior tibial tendinitis.  Return in about 4 weeks (around 10/05/2023) for f/u plantar fasciitis.   Joe Murders, DPM, FACFAS [facility]     [address] [place], [place] [address]                Office [phone]  Fax [phone]

## 2023-09-27 ENCOUNTER — Ambulatory Visit (INDEPENDENT_AMBULATORY_CARE_PROVIDER_SITE_OTHER): Admitting: Podiatry

## 2023-09-27 DIAGNOSIS — M722 Plantar fascial fibromatosis: Secondary | ICD-10-CM

## 2023-09-27 NOTE — Progress Notes (Unsigned)
 Patient still having some right plantar fascial pain but it is less in the heel and more in the medial and central plantar fascial bands in the middle one third aspect.  Cortisone injection was administered in this more anterior location today.  She does have lateral column pain on the right but this should resolve as the plantar fasciitis resolves.  Also she is wearing the power step she is doing some of the stretches but not any of the standing stretches where she leans into the wall.  Will do a plantar fascia brace today as well.  Follow-up in 4 weeks

## 2023-10-03 ENCOUNTER — Ambulatory Visit: Admitting: Podiatry

## 2023-10-26 ENCOUNTER — Ambulatory Visit (INDEPENDENT_AMBULATORY_CARE_PROVIDER_SITE_OTHER): Admitting: Podiatry

## 2023-10-26 DIAGNOSIS — M722 Plantar fascial fibromatosis: Secondary | ICD-10-CM

## 2023-10-26 DIAGNOSIS — M7751 Other enthesopathy of right foot: Secondary | ICD-10-CM | POA: Diagnosis not present

## 2023-10-26 NOTE — Progress Notes (Signed)
     Chief Complaint  Patient presents with   Plantar Fasciitis    Right foot. Some minor improvement since last visit. Doing stretches, using ice, wearing night splint and brace. Pain in heel is better but pain in lateral side of foot is not.    HPI: 63 y.o. female presents today for follow-up of plantar fasciitis on the right foot.  She is not having any pain in the plantar or posterior heel anymore.  She is now having pain near the middle portion of the fifth metatarsal on the right.  Denies injury.  This was present on her last visit but felt this was attributed to the lateral column pain that is often seen with chronic plantar fasciitis.  She also notes some numbness in that same area.  Past Medical History:  Diagnosis Date   Diabetes mellitus without complication (HCC)    type 2   History of kidney stones    Thyroid  nodule    Past Surgical History:  Procedure Laterality Date   ABDOMINAL HYSTERECTOMY     BIOPSY THYROID      CHOLECYSTECTOMY     THYROID  LOBECTOMY Left 06/09/2021   Procedure: LEFT THYROID  LOBECTOMY;  Surgeon: Eletha Boas, MD;  Location: [facility];  Service: General;  Laterality: Left;   TONSILLECTOMY     Allergies  Allergen Reactions   Shellfish Allergy Anaphylaxis and Rash   Penicillins Itching          Physical Exam: Palpable pedal pulses.  No pain on palpation to the plantar fascia or the posterior heel or Achilles tendon.  There is some pain on palpation to the peroneus tertius tendon on the right foot near its insertion into the fifth metatarsal shaft.  Mild decrease sensation in the same area.  No numbness in the fifth toe noted.  Assessment/Plan of Care: 1. Tendinitis of right foot   2. Plantar fasciitis, right     With the patient's consent, corticosteroid injection was administered to the peroneus tertius tendon near the fifth metatarsal midshaft area.  This consisted of a mixture of 1% lidocaine  plain, 0.5% Marcaine plain and Kenalog  10 for total of 1.25  cc administered.  She tolerated this well and a Band-Aid was applied.  She will continue with her stretching exercises and arch support for the plantar fasciitis.   Awanda CHARM Imperial, DPM, FACFAS [facility]     [address] [place], [place] [address]                Office [phone]  Fax [phone]

## 2023-10-31 DIAGNOSIS — K219 Gastro-esophageal reflux disease without esophagitis: Secondary | ICD-10-CM | POA: Diagnosis not present

## 2023-10-31 DIAGNOSIS — E1122 Type 2 diabetes mellitus with diabetic chronic kidney disease: Secondary | ICD-10-CM | POA: Diagnosis not present

## 2023-10-31 DIAGNOSIS — N182 Chronic kidney disease, stage 2 (mild): Secondary | ICD-10-CM | POA: Diagnosis not present

## 2023-10-31 DIAGNOSIS — I129 Hypertensive chronic kidney disease with stage 1 through stage 4 chronic kidney disease, or unspecified chronic kidney disease: Secondary | ICD-10-CM | POA: Diagnosis not present

## 2023-12-17 ENCOUNTER — Encounter: Payer: Self-pay | Admitting: Podiatry

## 2023-12-17 ENCOUNTER — Ambulatory Visit (INDEPENDENT_AMBULATORY_CARE_PROVIDER_SITE_OTHER): Admitting: Podiatry

## 2023-12-17 DIAGNOSIS — L600 Ingrowing nail: Secondary | ICD-10-CM | POA: Diagnosis not present

## 2023-12-17 DIAGNOSIS — L03031 Cellulitis of right toe: Secondary | ICD-10-CM | POA: Diagnosis not present

## 2023-12-17 MED ORDER — DOXYCYCLINE HYCLATE 100 MG PO CAPS: 100.0000 mg | ORAL_CAPSULE | Freq: Two times a day (BID) | ORAL | 0 refills | Status: AC

## 2023-12-17 NOTE — Patient Instructions (Signed)

## 2023-12-17 NOTE — Progress Notes (Signed)
 Subjective:  Patient ID: Chelsea Haynes, female    DOB: 10-19-1960,  MRN: [id number]  Hinley Brimage presents to clinic today for:  Chief Complaint  Patient presents with   Nail Problem    R great nail lateral pain w/ redness and swelling x 3 weeks.  L great nail lateral is beginning to feel infected.  Has been soaking in epson and peroxide.  Diabetic Type 2. A1c 6.4  No anti coag   Patient presents with a family member today with concern of ingrown toenails to the great toes.  She underwent ingrown toenail procedures by Dr. Malvin almost 2 years ago and states that the nails grow back and are now infected again.  She is open to having another procedure performed and wants to get rid of this as soon as possible because it is very painful.  She has had pus drainage most notably from the right great toenail along the lateral nail margin.  Allergies  Allergen Reactions   Shellfish Allergy Anaphylaxis and Rash   Dimethicone-Vinyl Dimethicone Crosspolymer [Dimethicone] Hives   Penicillins Itching         Objective:  Chelsea Haynes is a pleasant 63 y.o. female in NAD. AAO x 3.  Vascular Examination: Capillary refill time is 3-5 seconds to toes bilateral. Palpable pedal pulses b/l LE. Digital hair present b/l. No pedal edema b/l. Skin temperature gradient WNL b/l. No varicosities b/l. No cyanosis or clubbing noted b/l.   Dermatological Examination: There is incurvation of the bilateral hallux, lateral nail border.  There is pain on palpation of the affected nail border.  There is localized erythema and edema and evidence of recent drainage from the right hallux lateral nail border.  Neurological Examination: Epicritic sensation is intact to the toes.  Assessment/Plan: 1. Paronychia of great toe of right foot   2. Ingrown toenail     Meds ordered this encounter  Medications   doxycycline  (VIBRAMYCIN ) 100 MG capsule    Sig: Take 1 capsule (100 mg total) by mouth 2 (two) times  daily for 10 days.    Dispense:  20 capsule    Refill:  0    Discussed patient's condition today.  After obtaining patient consent, the bilateral hallux was anesthetized with a 50:50 mixture of 1% lidocaine  plain and 0.5% bupivacaine plain for a total of 3cc's administered.  Upon confirmation of anesthesia, a freer elevator was utilized to free the bilateral hallux, lateral nail border from the nail bed.  The nail borders were then avulsed proximal to the eponychium and removed in toto.  The area was inspected for any remaining spicules.  A chemical matrixectomy was performed with NaOH and neutralized with acetic acid solution.  Antibiotic ointment and a DSD were applied, followed by a Coban dressing.  Patient tolerated the anesthetic and procedure well and will f/u in 2-3 weeks for recheck.  Patient given post-procedure instructions for daily 15-minute Epsom salt soaks, antibiotic ointment and daily use of Bandaids until toe starts to dry / form eschar.    Return in about 2 weeks (around 12/31/2023) for PNA recheck ([place] office).   Awanda CHARM Imperial, DPM, FACFAS [facility]     [address], [place] [address]  Office [phone]  Fax [phone]

## 2024-01-02 ENCOUNTER — Ambulatory Visit (INDEPENDENT_AMBULATORY_CARE_PROVIDER_SITE_OTHER): Admitting: Podiatry

## 2024-01-02 DIAGNOSIS — L600 Ingrowing nail: Secondary | ICD-10-CM | POA: Diagnosis not present

## 2024-01-02 DIAGNOSIS — L03031 Cellulitis of right toe: Secondary | ICD-10-CM | POA: Diagnosis not present

## 2024-01-02 MED ORDER — SILVER SULFADIAZINE 1 % EX CREA: 1.0000 | TOPICAL_CREAM | Freq: Every day | CUTANEOUS | 0 refills | Status: AC

## 2024-01-02 NOTE — Progress Notes (Unsigned)
        Subjective:  Patient ID: Chelsea Haynes, female    DOB: 04/18/1960,  MRN: [id number]  Chief Complaint  Patient presents with   PNA Follow up    Left 1st is still red and has noticed some burning at times. The right 1st has some skin peeling and redness but no pain or irritation reported by patient. She is still doing the epsom soaks.    Adalynn Haynes presents to clinic today for f/u of PNA to the right hallux lateral nail border and left hallux lateral nail border.  She feels that the right great toe procedure area is healing well but she has had some burning and discomfort to the left great toe along the affected border.  She does not feel that there has been any significant drainage.  PCP is Barbarann Mallie DEL, PA.  Allergies  Allergen Reactions   Shellfish Allergy Anaphylaxis and Rash   Dimethicone-Vinyl Dimethicone Crosspolymer [Dimethicone] Hives   Penicillins Itching         Objective:  There were no vitals filed for this visit.  Vascular Examination: Capillary refill time is 3-5 seconds to toes bilateral. Palpable pedal pulses b/l LE. Digital hair present b/l. No pedal edema b/l. Skin temperature gradient WNL b/l. No varicosities b/l. No cyanosis or clubbing noted b/l.   Dermatological Examination: Upon inspection of the PNA site, there are no clinical signs of infection to the right hallux PNA site.  No purulence, no necrosis, no malodor present.  Minimal to no erythema present.  Eschar formed along nail margin.  Minimal to no pain on palpation of area.  On the left hallux PNA site, there is slightly more localized erythema and edema present.  There is discomfort on palpation along the groove on that margin that was treated.  Assessment/Plan: 1. Ingrown toenail   2. Paronychia of great toe of right foot     Meds ordered this encounter  Medications   silver  sulfADIAZINE  (SILVADENE ) 1 % cream    Sig: Apply 1 Application topically daily. Cover with Bandaid during the day  when wearing shoes.    Dispense:  20 g    Refill:  0   Discussed findings with patient today.  Will have her D/C the Neosporin and start with Silvadene  1% cream once daily to the left great toenail and procedure area.  This prescription was sent to her pharmacy today.  She will cover with a Band-Aid during the day but leave open to the air at home and at night.  She is probably safe to discontinue this in approximately 1 week from now.  She is to call if any signs of worsening develop.  Return if symptoms worsen or fail to improve.   Awanda CHARM Imperial, DPM, FACFAS [facility]     [address] [place], [place] [address]                Office [phone]  Fax [phone]

## 2024-02-12 DIAGNOSIS — E1122 Type 2 diabetes mellitus with diabetic chronic kidney disease: Secondary | ICD-10-CM | POA: Diagnosis not present

## 2024-02-12 DIAGNOSIS — K219 Gastro-esophageal reflux disease without esophagitis: Secondary | ICD-10-CM | POA: Diagnosis not present

## 2024-02-12 DIAGNOSIS — N182 Chronic kidney disease, stage 2 (mild): Secondary | ICD-10-CM | POA: Diagnosis not present

## 2024-02-12 DIAGNOSIS — I129 Hypertensive chronic kidney disease with stage 1 through stage 4 chronic kidney disease, or unspecified chronic kidney disease: Secondary | ICD-10-CM | POA: Diagnosis not present

## 2024-02-15 ENCOUNTER — Encounter: Payer: Self-pay | Admitting: Gastroenterology

## 2024-04-14 ENCOUNTER — Ambulatory Visit: Admitting: Gastroenterology

## 2024-05-08 ENCOUNTER — Other Ambulatory Visit (INDEPENDENT_AMBULATORY_CARE_PROVIDER_SITE_OTHER)

## 2024-05-08 ENCOUNTER — Ambulatory Visit: Admitting: Gastroenterology

## 2024-05-08 ENCOUNTER — Encounter: Payer: Self-pay | Admitting: Gastroenterology

## 2024-05-08 VITALS — BP 130/88 | HR 84 | Ht 64.0 in | Wt 253.0 lb

## 2024-05-08 DIAGNOSIS — R152 Fecal urgency: Secondary | ICD-10-CM | POA: Diagnosis not present

## 2024-05-08 DIAGNOSIS — R109 Unspecified abdominal pain: Secondary | ICD-10-CM | POA: Diagnosis not present

## 2024-05-08 DIAGNOSIS — K529 Noninfective gastroenteritis and colitis, unspecified: Secondary | ICD-10-CM

## 2024-05-08 DIAGNOSIS — K625 Hemorrhage of anus and rectum: Secondary | ICD-10-CM

## 2024-05-08 LAB — C-REACTIVE PROTEIN: CRP: 0.6 mg/dL — ABNORMAL LOW (ref 1.0–20.0)

## 2024-05-08 MED ORDER — NA SULFATE-K SULFATE-MG SULF 17.5-3.13-1.6 GM/177ML PO SOLN: 1.0000 | Freq: Once | ORAL | 0 refills | Status: AC

## 2024-05-08 NOTE — Patient Instructions (Addendum)
 Altered bowel habits  Start OTC Psyllium husk 1 tsp po daily  Can use imodium 2 capsules twice daily as needed, avoid 2 weeks before procedure  Your provider has requested that you go to the basement level for lab work before leaving today. Press B on the elevator. The lab is located at the first door on the left as you exit the elevator.  We have sent the following medications to your pharmacy for you to pick up at your convenience: SUPREP  You have been scheduled for a colonoscopy. Please follow written instructions given to you at your visit today.   If you use inhalers (even only as needed), please bring them with you on the day of your procedure.  DO NOT TAKE 7 DAYS PRIOR TO TEST- Trulicity (dulaglutide) Ozempic, Wegovy (semaglutide) Mounjaro, Zepbound (tirzepatide) Bydureon Bcise (exanatide extended release)  DO NOT TAKE 1 DAY PRIOR TO YOUR TEST Rybelsus (semaglutide) Adlyxin (lixisenatide) Victoza (liraglutide) Byetta (exanatide) ___________________________________________________________________________  Due to recent changes in healthcare laws, you may see the results of your imaging and laboratory studies on MyChart before your provider has had a chance to review them.  We understand that in some cases there may be results that are confusing or concerning to you. Not all laboratory results come back in the same time frame and the provider may be waiting for multiple results in order to interpret others.  Please give us  48 hours in order for your provider to thoroughly review all the results before contacting the office for clarification of your results.   You have been scheduled for a colonoscopy. Please follow written instructions given to you at your visit today.   If you use inhalers (even only as needed), please bring them with you on the day of your procedure.  DO NOT TAKE 7 DAYS PRIOR TO TEST- Trulicity (dulaglutide) Ozempic, Wegovy (semaglutide) Mounjaro, Zepbound  (tirzepatide) Bydureon Bcise (exanatide extended release)  DO NOT TAKE 1 DAY PRIOR TO YOUR TEST Rybelsus (semaglutide) Adlyxin (lixisenatide) Victoza (liraglutide) Byetta (exanatide) ___________________________________________________________________________   Thank you for trusting me with your gastrointestinal care. Deanna May, FNP-C

## 2024-05-08 NOTE — Progress Notes (Signed)
 "  Chief Complaint:diarrhea, discuss colonoscopy Primary GI Doctor: Dr. Charlanne  HPI:  Chelsea Haynes is a  64  year old female Chelsea Haynes with past medical history of DM type 2, GERD, hypertension, and hyperlipidemia, who was self referred to me for a evaluation of diarrhea, discuss colonoscopy .    Interval History Chelsea Haynes presents for evaluation of chronic diarrhea with new onset fecal urgency.  Chelsea Haynes reports she has had chronic diarrhea for several years. She was told by Dr. Charlanne 10 years ago after her colonoscopy she probably had IBS-D. Chelsea Haynes reports as of recent she has had issues with bowel urgency postprandial. She notes explosive diarrhea and can happen in public or at a restaurant after eating. This affects her at work with only access to one restroom.  She will have 5-6 loose stools per day. No nocturnal symptoms.  She will have occasional episodes she has urge to have BM and nothing comes out.  Intermittent BRBPR with wiping she suspects due to her internal hemorrhoids. She has a lot of stomach rumbling and uneasiness improved with OTC Tums.  History of lap cholesecetomy , reports diarrhea started prior to surgery. No new medications, has been on Ozempic and Xigduo for several years. She notes diarrhea does slow down 1-2 days after Ozempic shot but then restarts again.  Occasional reflux controlled with OTC Tums. No dysphagia.  Nonsmoker. No alcohol use.   Surgical history: lap cholecystectomy , partial thyroidectomy   Chelsea Haynes's family history includes; father with lung CA (smoker), uncles with lung CA, Aunts with stomach issues?, nephew with EOE  Wt Readings from Last 3 Encounters:  05/08/24 253 lb (114.8 kg)  06/09/21 270 lb 1 oz (122.5 kg)  06/01/21 270 lb (122.5 kg)     Past Medical History:  Diagnosis Date   Diabetes mellitus without complication (HCC)    type 2   GERD (gastroesophageal reflux disease)    History of asthma    History of kidney stones     Hyperlipidemia    Obesity    Thyroid  nodule     Past Surgical History:  Procedure Laterality Date   ABDOMINAL HYSTERECTOMY     ADENOIDECTOMY     APPENDECTOMY     BIOPSY THYROID      CHOLECYSTECTOMY     COLONOSCOPY  08/13/2015   Small internal hemorrhoids. Otherwise normal colonoscopy   ESOPHAGOGASTRODUODENOSCOPY  10/24/2006   Mild gastritis. Otherwise normal EGD. No etiology of right upper quadrant abdominal pain   THYROID  LOBECTOMY Left 06/09/2021   Procedure: LEFT THYROID  LOBECTOMY;  Surgeon: Eletha Boas, MD;  Location: [facility];  Service: General;  Laterality: Left;   TONSILLECTOMY      Current Outpatient Medications  Medication Sig Dispense Refill   acetaminophen  (TYLENOL ) 500 MG tablet Take 1,000 mg by mouth every 6 (six) hours as needed for headache or moderate pain.     albuterol (VENTOLIN HFA) 108 (90 Base) MCG/ACT inhaler Inhale 2 puffs into the lungs every 6 (six) hours as needed.     clarithromycin (BIAXIN) 500 MG tablet Take 500 mg by mouth 2 (two) times daily.     ibuprofen  (ADVIL ) 600 MG tablet Take 1 tablet (600 mg total) by mouth every 8 (eight) hours as needed. 30 tablet 0   meloxicam (MOBIC) 15 MG tablet Take 15 mg by mouth daily.     methocarbamol (ROBAXIN) 500 MG tablet Take 500 mg by mouth 2 (two) times daily.     OZEMPIC, 0.25 OR 0.5 MG/DOSE, 2 MG/3ML SOPN  SMARTSIG:0.5 Milligram(s) Once a Week     OZEMPIC, 2 MG/DOSE, 8 MG/3ML SOPN SMARTSIG:2 Milligram(s) Once a Week     predniSONE (DELTASONE) 10 MG tablet Take 10 mg by mouth 2 (two) times daily.     XIGDUO XR 08-998 MG TB24 Take 1 tablet by mouth daily.     silver  sulfADIAZINE  (SILVADENE ) 1 % cream Apply 1 Application topically daily. Cover with Bandaid during the day when wearing shoes. (Chelsea Haynes not taking: Reported on 05/08/2024) 20 g 0   traMADol  (ULTRAM ) 50 MG tablet Take 1-2 tablets (50-100 mg total) by mouth every 6 (six) hours as needed for moderate pain. (Chelsea Haynes not taking: Reported on 05/08/2024) 15  tablet 0   No current facility-administered medications for this visit.    Allergies as of 05/08/2024 - Review Complete 05/08/2024  Allergen Reaction Noted   Shellfish allergy Anaphylaxis and Rash 05/30/2021   Dimethicone-vinyl dimethicone crosspolymer [dimethicone] Hives 12/17/2023   Penicillins Itching 09/16/2019    Family History  Problem Relation Age of Onset   Parkinson's disease Mother    Lung cancer Father     Review of Systems:    Constitutional: No weight loss, fever, chills, weakness or fatigue HEENT: Eyes: No change in vision               Ears, Nose, Throat:  No change in hearing or congestion Skin: No rash or itching Cardiovascular: No chest pain, chest pressure or palpitations   Respiratory: No SOB or cough Gastrointestinal: See HPI and otherwise negative Genitourinary: No dysuria or change in urinary frequency Neurological: No headache, dizziness or syncope Musculoskeletal: No new muscle or joint pain Hematologic: No bleeding or bruising Psychiatric: No history of depression or anxiety    Physical Exam:  Vital signs: BP 130/88   Pulse 84   Ht 5' 4 (1.626 m)   Wt 253 lb (114.8 kg)   BMI 43.43 kg/m   Constitutional:   Pleasant female appears to be in NAD, Well developed, Well nourished, alert and cooperative Eyes:   PEERL, EOMI. No icterus. Conjunctiva pink. Neck:  Supple Throat: Oral cavity and pharynx without inflammation, swelling or lesion.  Respiratory: Respirations even and unlabored. Lungs clear to auscultation bilaterally.   No wheezes, crackles, or rhonchi.  Cardiovascular: Normal S1, S2. Regular rate and rhythm. No peripheral edema, cyanosis or pallor.  Gastrointestinal:  Soft, nondistended, nontender. No rebound or guarding. Normal bowel sounds. No appreciable masses or hepatomegaly. Rectal:  Not performed.  Msk:  Symmetrical without gross deformities. Without edema, no deformity or joint abnormality.  Neurologic:  Alert and  oriented x4;   grossly normal neurologically.  Skin:   Dry and intact without significant lesions or rashes.  RELEVANT LABS AND IMAGING: CBC    Latest Ref Rng & Units 06/01/2021    8:33 AM  CBC  WBC 4.0 - 10.5 K/uL 7.0   Hemoglobin 12.0 - 15.0 g/dL 86.2   Hematocrit 63.9 - 46.0 % 43.8   Platelets 150 - 400 K/uL 325      CMP     Latest Ref Rng & Units 06/01/2021    8:33 AM  CMP  Glucose 70 - 99 mg/dL 837   BUN 6 - 20 mg/dL 19   Creatinine 9.55 - 1.00 mg/dL 9.26   Sodium 864 - 854 mmol/L 138   Potassium 3.5 - 5.1 mmol/L 4.4   Chloride 98 - 111 mmol/L 104   CO2 22 - 32 mmol/L 27   Calcium 8.9 - 10.3  mg/dL 8.8     07/7980 colonoscopy at [facility] internal hemorrhoids Otherwise normal colonoscopy   Assessment: Encounter Diagnoses  Name Primary?   Chronic diarrhea Yes   Fecal urgency    Abdominal cramping    Rectal bleeding   64 year old female Chelsea Haynes with history of chronic diarrhea, recently has been experiencing new onset of fecal urgency that typically occurs postprandial.  Last colonoscopy was in [facility] 2017 and unremarkable.  Chelsea Haynes is on medications for diabetes which does increase the risk of diarrhea but states her symptoms were prior to starting any of these medications.  Does not recall having any other formal testing done.  Will go ahead and check celiac panel to rule out celiac disease as well as inflammatory marker CRP.  We also discussed adding psyllium husk which Ramatoulaye Pack help with consistency of stool.  Recommend low FODMAP diet.  Chelsea Haynes could try over-the-counter Imodium and if not effective we discussed cholestyramine as another option.  Will go ahead and schedule colon screening colonoscopy with random biopsies to rule out microscopic colitis.   Plan: 1. Check CRP, TTG IgA, IgA 2. Recommend low fodmap diet 3. Start OTC Psyllium husk 1 tsp po daily  4. Can use imodium prn, avoid 2 weeks prior to procedures 5. If no improvement , can consider cholestyramine  6. Schedule for  a colonoscopy in [facility] with Dr. Charlanne. The risks and benefits of colonoscopy with possible polypectomy / biopsies were discussed and the Chelsea Haynes agrees to proceed.   -Hold ozempic 1 week prior    Thank you for the courtesy of this consult. Please call me with any questions or concerns.   Agnes Brightbill, FNP-C [facility] 05/08/2024, 11:56 AM  Cc: Barbarann Mallie DEL, PA  "

## 2024-05-10 LAB — TISSUE TRANSGLUTAMINASE, IGA: (tTG) Ab, IgA: <1 U/mL

## 2024-05-10 LAB — IGA: Immunoglobulin A: 340 mg/dL — ABNORMAL HIGH (ref 70–320)

## 2024-05-13 ENCOUNTER — Ambulatory Visit: Payer: Self-pay | Admitting: Gastroenterology

## 2024-05-23 ENCOUNTER — Encounter: Admitting: Gastroenterology
